# Patient Record
Sex: Male | Born: 1975 | Race: Black or African American | Hispanic: No | Smoking: Never smoker
Health system: Southern US, Community
[De-identification: ages and names within clinical notes are randomized; demographics above are authoritative.]

## PROBLEM LIST (undated history)

## (undated) DIAGNOSIS — G43909 Migraine, unspecified, not intractable, without status migrainosus: Secondary | ICD-10-CM

## (undated) DIAGNOSIS — K219 Gastro-esophageal reflux disease without esophagitis: Secondary | ICD-10-CM

## (undated) DIAGNOSIS — E119 Type 2 diabetes mellitus without complications: Secondary | ICD-10-CM

## (undated) HISTORY — PX: WRIST SURGERY: SHX841

## (undated) HISTORY — PX: PANCREAS SURGERY: SHX731

---

## 2015-11-13 ENCOUNTER — Encounter (HOSPITAL_COMMUNITY): Payer: Self-pay

## 2015-11-13 ENCOUNTER — Inpatient Hospital Stay (HOSPITAL_COMMUNITY): Payer: Medicaid - Out of State

## 2015-11-13 ENCOUNTER — Inpatient Hospital Stay (HOSPITAL_COMMUNITY)
Admission: EM | Admit: 2015-11-13 | Discharge: 2015-11-15 | DRG: 193 | Disposition: A | Discharge status: Home / Self Care | Payer: Medicaid - Out of State | Attending: Internal Medicine | Admitting: Internal Medicine

## 2015-11-13 ENCOUNTER — Emergency Department (HOSPITAL_COMMUNITY): Payer: Medicaid - Out of State

## 2015-11-13 DIAGNOSIS — J9601 Acute respiratory failure with hypoxia: Secondary | ICD-10-CM | POA: Diagnosis present

## 2015-11-13 DIAGNOSIS — Z8701 Personal history of pneumonia (recurrent): Secondary | ICD-10-CM | POA: Diagnosis not present

## 2015-11-13 DIAGNOSIS — Z9114 Patient's other noncompliance with medication regimen: Secondary | ICD-10-CM

## 2015-11-13 DIAGNOSIS — E860 Dehydration: Secondary | ICD-10-CM | POA: Diagnosis present

## 2015-11-13 DIAGNOSIS — E871 Hypo-osmolality and hyponatremia: Secondary | ICD-10-CM | POA: Diagnosis present

## 2015-11-13 DIAGNOSIS — J189 Pneumonia, unspecified organism: Secondary | ICD-10-CM | POA: Diagnosis not present

## 2015-11-13 DIAGNOSIS — B009 Herpesviral infection, unspecified: Secondary | ICD-10-CM | POA: Diagnosis present

## 2015-11-13 DIAGNOSIS — E1365 Other specified diabetes mellitus with hyperglycemia: Secondary | ICD-10-CM

## 2015-11-13 DIAGNOSIS — E0865 Diabetes mellitus due to underlying condition with hyperglycemia: Secondary | ICD-10-CM | POA: Diagnosis present

## 2015-11-13 DIAGNOSIS — K219 Gastro-esophageal reflux disease without esophagitis: Secondary | ICD-10-CM | POA: Diagnosis present

## 2015-11-13 DIAGNOSIS — Z8619 Personal history of other infectious and parasitic diseases: Secondary | ICD-10-CM

## 2015-11-13 DIAGNOSIS — E08649 Diabetes mellitus due to underlying condition with hypoglycemia without coma: Secondary | ICD-10-CM | POA: Diagnosis not present

## 2015-11-13 DIAGNOSIS — R197 Diarrhea, unspecified: Secondary | ICD-10-CM

## 2015-11-13 DIAGNOSIS — R000 Tachycardia, unspecified: Secondary | ICD-10-CM

## 2015-11-13 DIAGNOSIS — Z23 Encounter for immunization: Secondary | ICD-10-CM

## 2015-11-13 DIAGNOSIS — Z794 Long term (current) use of insulin: Secondary | ICD-10-CM

## 2015-11-13 DIAGNOSIS — E1165 Type 2 diabetes mellitus with hyperglycemia: Secondary | ICD-10-CM | POA: Diagnosis present

## 2015-11-13 HISTORY — DX: Type 2 diabetes mellitus without complications: E11.9

## 2015-11-13 HISTORY — DX: Gastro-esophageal reflux disease without esophagitis: K21.9

## 2015-11-13 HISTORY — DX: Cystic fibrosis, unspecified: E84.9

## 2015-11-13 LAB — CBC WITH DIFFERENTIAL/PLATELET
BASOS ABS: 0 10*3/uL (ref 0.0–0.1)
BASOS PCT: 1 %
EOS ABS: 0.1 10*3/uL (ref 0.0–0.7)
EOS PCT: 1 %
HCT: 33.5 % — ABNORMAL LOW (ref 39.0–52.0)
Hemoglobin: 11 g/dL — ABNORMAL LOW (ref 13.0–17.0)
Lymphocytes Relative: 18 %
Lymphs Abs: 1.4 10*3/uL (ref 0.7–4.0)
MCH: 29.9 pg (ref 26.0–34.0)
MCHC: 32.8 g/dL (ref 30.0–36.0)
MCV: 91 fL (ref 78.0–100.0)
MONOS PCT: 15 %
Monocytes Absolute: 1.2 10*3/uL — ABNORMAL HIGH (ref 0.1–1.0)
NEUTROS ABS: 5.3 10*3/uL (ref 1.7–7.7)
Neutrophils Relative %: 65 %
PLATELETS: 286 10*3/uL (ref 150–400)
RBC: 3.68 MIL/uL — ABNORMAL LOW (ref 4.22–5.81)
RDW: 12.9 % (ref 11.5–15.5)
WBC: 8 10*3/uL (ref 4.0–10.5)

## 2015-11-13 LAB — BASIC METABOLIC PANEL
ANION GAP: 9 (ref 5–15)
BUN: 7 mg/dL (ref 6–20)
CALCIUM: 8.4 mg/dL — AB (ref 8.9–10.3)
CO2: 23 mmol/L (ref 22–32)
CREATININE: 0.84 mg/dL (ref 0.61–1.24)
Chloride: 98 mmol/L — ABNORMAL LOW (ref 101–111)
Glucose, Bld: 409 mg/dL — ABNORMAL HIGH (ref 65–99)
Potassium: 4.3 mmol/L (ref 3.5–5.1)
SODIUM: 130 mmol/L — AB (ref 135–145)

## 2015-11-13 LAB — GLUCOSE, CAPILLARY
GLUCOSE-CAPILLARY: 198 mg/dL — AB (ref 65–99)
GLUCOSE-CAPILLARY: 146 mg/dL — AB (ref 65–99)
GLUCOSE-CAPILLARY: 56 mg/dL — AB (ref 65–99)
Glucose-Capillary: 355 mg/dL — ABNORMAL HIGH (ref 65–99)
Glucose-Capillary: 331 mg/dL — ABNORMAL HIGH (ref 65–99)
Glucose-Capillary: 137 mg/dL — ABNORMAL HIGH (ref 65–99)

## 2015-11-13 LAB — RAPID URINE DRUG SCREEN, HOSP PERFORMED
AMPHETAMINES: NOT DETECTED
BENZODIAZEPINES: NOT DETECTED
Barbiturates: NOT DETECTED
Cocaine: NOT DETECTED
OPIATES: POSITIVE — AB
Tetrahydrocannabinol: NOT DETECTED

## 2015-11-13 LAB — INFLUENZA PANEL BY PCR (TYPE A & B)
H1N1FLUPCR: NOT DETECTED
Influenza A By PCR: NEGATIVE
Influenza B By PCR: NEGATIVE

## 2015-11-13 LAB — PROCALCITONIN: Procalcitonin: <0.1 ng/mL

## 2015-11-13 LAB — STREP PNEUMONIAE URINARY ANTIGEN: STREP PNEUMO URINARY ANTIGEN: NEGATIVE

## 2015-11-13 MED ORDER — SODIUM CHLORIDE 7 % IN NEBU: 4.0000 mL | INHALATION_SOLUTION | Freq: Every day | RESPIRATORY_TRACT | Status: DC

## 2015-11-13 MED ORDER — INSULIN ASPART 100 UNIT/ML ~~LOC~~ SOLN
0.0000 [IU] | Freq: Three times a day (TID) | SUBCUTANEOUS | Status: DC
  Administered 2015-11-13: 2 [IU] via SUBCUTANEOUS

## 2015-11-13 MED ORDER — INSULIN DETEMIR 100 UNIT/ML ~~LOC~~ SOLN
40.0000 [IU] | Freq: Every day | SUBCUTANEOUS | Status: DC
  Administered 2015-11-13: 40 [IU] via SUBCUTANEOUS
  Filled 2015-11-13: qty 0.4

## 2015-11-13 MED ORDER — PANCRELIPASE (LIP-PROT-AMYL) 12000-38000 UNITS PO CPEP: 48000.0000 [IU] | ORAL_CAPSULE | Freq: Three times a day (TID) | ORAL | Status: DC

## 2015-11-13 MED ORDER — BUDESONIDE 0.25 MG/2ML IN SUSP
0.2500 mg | Freq: Two times a day (BID) | RESPIRATORY_TRACT | Status: DC
  Administered 2015-11-13 – 2015-11-15 (×5): 0.25 mg via RESPIRATORY_TRACT
  Filled 2015-11-13 (×5): qty 2

## 2015-11-13 MED ORDER — MAGNESIUM SULFATE 2 GM/50ML IV SOLN
2.0000 g | Freq: Once | INTRAVENOUS | Status: AC
  Administered 2015-11-13: 2 g via INTRAVENOUS
  Filled 2015-11-13: qty 50

## 2015-11-13 MED ORDER — IOHEXOL 350 MG/ML SOLN
100.0000 mL | Freq: Once | INTRAVENOUS | Status: AC | PRN
  Administered 2015-11-13: 100 mL via INTRAVENOUS

## 2015-11-13 MED ORDER — SODIUM CHLORIDE 0.9 % IV SOLN
INTRAVENOUS | Status: DC
  Administered 2015-11-13 – 2015-11-14 (×3): via INTRAVENOUS

## 2015-11-13 MED ORDER — PREDNISONE 20 MG PO TABS
60.0000 mg | ORAL_TABLET | Freq: Once | ORAL | Status: AC
  Administered 2015-11-13: 60 mg via ORAL
  Filled 2015-11-13: qty 3

## 2015-11-13 MED ORDER — ADULT MULTIVITAMIN W/MINERALS CH
1.0000 | ORAL_TABLET | Freq: Every day | ORAL | Status: DC
  Administered 2015-11-13 – 2015-11-15 (×3): 1 via ORAL
  Filled 2015-11-13 (×3): qty 1

## 2015-11-13 MED ORDER — ALBUTEROL (5 MG/ML) CONTINUOUS INHALATION SOLN
10.0000 mg/h | INHALATION_SOLUTION | RESPIRATORY_TRACT | Status: DC
  Administered 2015-11-13: 10 mg/h via RESPIRATORY_TRACT
  Filled 2015-11-13: qty 20

## 2015-11-13 MED ORDER — SODIUM CHLORIDE 0.9 % IV BOLUS (SEPSIS)
1000.0000 mL | Freq: Once | INTRAVENOUS | Status: AC
  Administered 2015-11-13: 1000 mL via INTRAVENOUS

## 2015-11-13 MED ORDER — INSULIN DETEMIR 100 UNIT/ML ~~LOC~~ SOLN
45.0000 [IU] | Freq: Every day | SUBCUTANEOUS | Status: DC
  Filled 2015-11-13: qty 0.45

## 2015-11-13 MED ORDER — ACETAMINOPHEN 325 MG PO TABS
650.0000 mg | ORAL_TABLET | Freq: Four times a day (QID) | ORAL | Status: DC | PRN
  Administered 2015-11-13 – 2015-11-14 (×2): 650 mg via ORAL
  Filled 2015-11-13 (×2): qty 2

## 2015-11-13 MED ORDER — SODIUM CHLORIDE 3 % IN NEBU
4.0000 mL | INHALATION_SOLUTION | Freq: Every day | RESPIRATORY_TRACT | Status: DC
  Administered 2015-11-14 – 2015-11-15 (×2): 4 mL via RESPIRATORY_TRACT
  Filled 2015-11-13 (×4): qty 4

## 2015-11-13 MED ORDER — INSULIN ASPART 100 UNIT/ML ~~LOC~~ SOLN
0.0000 [IU] | SUBCUTANEOUS | Status: DC
  Administered 2015-11-13: 2 [IU] via SUBCUTANEOUS
  Administered 2015-11-13: 7 [IU] via SUBCUTANEOUS

## 2015-11-13 MED ORDER — ENOXAPARIN SODIUM 40 MG/0.4ML ~~LOC~~ SOLN
40.0000 mg | SUBCUTANEOUS | Status: DC
  Administered 2015-11-13 – 2015-11-14 (×2): 40 mg via SUBCUTANEOUS
  Filled 2015-11-13 (×3): qty 0.4

## 2015-11-13 MED ORDER — DEXTROSE 5 % IV SOLN
2.0000 g | Freq: Three times a day (TID) | INTRAVENOUS | Status: DC
  Administered 2015-11-13 – 2015-11-14 (×5): 2 g via INTRAVENOUS
  Filled 2015-11-13 (×8): qty 2

## 2015-11-13 MED ORDER — LEVOFLOXACIN IN D5W 750 MG/150ML IV SOLN
750.0000 mg | INTRAVENOUS | Status: DC
  Administered 2015-11-14: 750 mg via INTRAVENOUS
  Filled 2015-11-13: qty 150

## 2015-11-13 MED ORDER — LEVOFLOXACIN IN D5W 750 MG/150ML IV SOLN
750.0000 mg | Freq: Once | INTRAVENOUS | Status: AC
  Administered 2015-11-13: 750 mg via INTRAVENOUS
  Filled 2015-11-13: qty 150

## 2015-11-13 MED ORDER — INSULIN ASPART 100 UNIT/ML ~~LOC~~ SOLN: 0.0000 [IU] | Freq: Every day | SUBCUTANEOUS | Status: DC

## 2015-11-13 MED ORDER — IPRATROPIUM BROMIDE 0.02 % IN SOLN
1.0000 mg | Freq: Once | RESPIRATORY_TRACT | Status: AC
  Administered 2015-11-13: 1 mg via RESPIRATORY_TRACT
  Filled 2015-11-13: qty 5

## 2015-11-13 MED ORDER — PANTOPRAZOLE SODIUM 40 MG PO TBEC
40.0000 mg | DELAYED_RELEASE_TABLET | Freq: Every day | ORAL | Status: DC
  Administered 2015-11-13 – 2015-11-15 (×3): 40 mg via ORAL
  Filled 2015-11-13 (×3): qty 1

## 2015-11-13 MED ORDER — INSULIN ASPART 100 UNIT/ML ~~LOC~~ SOLN
3.0000 [IU] | Freq: Three times a day (TID) | SUBCUTANEOUS | Status: DC
  Administered 2015-11-14: 3 [IU] via SUBCUTANEOUS

## 2015-11-13 MED ORDER — PANCRELIPASE (LIP-PROT-AMYL) 12000-38000 UNITS PO CPEP: 48000.0000 [IU] | ORAL_CAPSULE | Freq: Two times a day (BID) | ORAL | Status: DC | PRN

## 2015-11-13 MED ORDER — GUAIFENESIN ER 600 MG PO TB12
600.0000 mg | ORAL_TABLET | Freq: Two times a day (BID) | ORAL | Status: DC
  Administered 2015-11-13 – 2015-11-15 (×5): 600 mg via ORAL
  Filled 2015-11-13 (×5): qty 1

## 2015-11-13 MED ORDER — CEFEPIME HCL 2 G IJ SOLR
2.0000 g | Freq: Once | INTRAMUSCULAR | Status: AC
  Administered 2015-11-13: 2 g via INTRAVENOUS
  Filled 2015-11-13: qty 2

## 2015-11-13 MED ORDER — ALBUTEROL SULFATE (2.5 MG/3ML) 0.083% IN NEBU
2.5000 mg | INHALATION_SOLUTION | RESPIRATORY_TRACT | Status: DC | PRN
  Administered 2015-11-15: 2.5 mg via RESPIRATORY_TRACT
  Filled 2015-11-13: qty 3

## 2015-11-13 MED ORDER — PANCRELIPASE (LIP-PROT-AMYL) 12000-38000 UNITS PO CPEP
72000.0000 [IU] | ORAL_CAPSULE | Freq: Three times a day (TID) | ORAL | Status: DC
  Administered 2015-11-13 – 2015-11-15 (×7): 72000 [IU] via ORAL
  Filled 2015-11-13 (×7): qty 6

## 2015-11-13 NOTE — Progress Notes (Signed)
PROGRESS NOTE  Clinton SchullerRobert Mitchell ZOX:[id number] DOB: [birth year] DOA: 11/13/2015 PCP: Pcp Not In System  Brief History 39 year old male with a history of cystic fibrosis and diabetes mellitus presented with acute onset of pleuritic chest pain and shortness of breath while he was at work on the evening of 11/12/2015. The patient states that he has been in his usual state of health in this past week without any fevers, chills, shortness of breath, coughing, vomiting. He denies any recent sick contacts. While at work, he had an incessant coughing episode with associated chest pain or shortness of breath. As a result, EMS was activated. The patient has altered his story to different physicians. He previously stated he was hospitalized 2 months prior to this admission with a similar illness. However, he stated to me he has not been hospitalized for "several years" with pneumonia.  The patient also previously stated [place] pul[place] in [place], but he told me he sees a pul[place] in [place]. he denies any recent sick contacts. He states that he has not been very faithful to his Qvar inhaler. Assessment/Plan: Acute respiratory failure with hypoxia -Concerns for infectious etiology versus occupational exposure -Concern for pulmonary embolus given the acuity of the patient's presentation -CT angiogram chest -HIV antibody  -urine drug screen Pulmonary infiltrates  -treat as an infectious etiology for now -Influenza PCR negative  -Respiratory viral panel  -Continue cefepime and levofloxacin pending culture data -procalcitonin Diabetes mellitus with hyperglycemia -C peptide -increase levemir to 45 units -add novolog 3 units with meals Cystic Fibrosis -continue pulmicort -continue Creon Pseudohyponatremia  -Secondary to elevated blood sugar   Family Communication:   Pt at beside Disposition Plan:   Home 1-2 days if stable       Procedures/Studies: Dg Chest 2  View  11/13/2015  CLINICAL DATA:  39 year old male with cough. History of cystic fibrosis. EXAM: CHEST  2 VIEW COMPARISON:  None. FINDINGS: Two views of the chest demonstrate diffuse bilateral airspace nodular opacities predominantly involving the upper and mid lung fields most concerning for pneumonia. There is no focal consolidation, pleural effusion, or pneumothorax. The cardiac silhouette is within normal limits. The osseous structures are grossly unremarkable. IMPRESSION: Bilateral upper and mid lung field airspace nodular densities concerning for pneumonia. Clinical correlation is recommended. Electronically Signed   By: Elgie CollardArash  Radparvar M.D.   On: 11/13/2015 01:59         Subjective: Patient denies fevers, chills, headache, chest pain, dyspnea, nausea, vomiting, diarrhea, abdominal pain, dysuria, hematuria   Objective: Filed Vitals:   11/13/15 0513 11/13/15 0838 11/13/15 1012 11/13/15 1648  BP: 111/73 112/71  129/77  Pulse: 97 92  89  Temp: 98.2 F (36.8 C) 98 F (36.7 C)  98.1 F (36.7 C)  TempSrc: Oral Oral  Oral  Resp: 18 17  18   Height:      Weight: 68.9 kg (151 lb 14.4 oz)     SpO2: 97% 97% 96% 97%    Intake/Output Summary (Last 24 hours) at 11/13/15 1655 Last data filed at 11/13/15 1450  Gross per 24 hour  Intake 741.25 ml  Output    950 ml  Net -208.75 ml   Weight change:  Exam:   General:  Pt is alert, follows commands appropriately, not in acute distress  HEENT: No icterus, No thrush, No neck mass, Benson/AT  Cardiovascular: RRR, S1/S2, no rubs, no gallops  Respiratory: Bilateral crackles. No wheeze. Good air  movement   Abdomen: Soft/+BS, non tender, non distended, no guarding; no hepatosplenomegaly   Extremities: No edema, No lymphangitis, No petechiae, No rashes, no synovitis; clubbing without cyanosis   Data Reviewed: Basic Metabolic Panel:  Recent Labs Lab 11/13/15 0138  NA 130*  K 4.3  CL 98*  CO2 23  GLUCOSE 409*  BUN 7  CREATININE  0.84  CALCIUM 8.4*   Liver Function Tests: No results for input(s): AST, ALT, ALKPHOS, BILITOT, PROT, ALBUMIN in the last 168 hours. No results for input(s): LIPASE, AMYLASE in the last 168 hours. No results for input(s): AMMONIA in the last 168 hours. CBC:  Recent Labs Lab 11/13/15 0138  WBC 8.0  NEUTROABS 5.3  HGB 11.0*  HCT 33.5*  MCV 91.0  PLT 286   Cardiac Enzymes: No results for input(s): CKTOTAL, CKMB, CKMBINDEX, TROPONINI in the last 168 hours. BNP: Invalid input(s): POCBNP CBG:  Recent Labs Lab 11/13/15 0530 11/13/15 0757 11/13/15 1208  GLUCAP 355* 331* 198*    No results found for this or any previous visit (from the past 240 hour(s)).   Scheduled Meds: . budesonide (PULMICORT) nebulizer solution  0.25 mg Nebulization BID  . ceFEPime (MAXIPIME) IV  2 g Intravenous 3 times per day  . enoxaparin (LOVENOX) injection  40 mg Subcutaneous Q24H  . guaiFENesin  600 mg Oral BID  . insulin aspart  0-9 Units Subcutaneous 6 times per day  . insulin detemir  40 Units Subcutaneous Daily  . [START ON 11/14/2015] levofloxacin (LEVAQUIN) IV  750 mg Intravenous Q24H  . lipase/protease/amylase  72,000 Units Oral TID WC  . multivitamin with minerals  1 tablet Oral Daily  . pantoprazole  40 mg Oral Daily  . sodium chloride HYPERTONIC  4 mL Nebulization Daily   Continuous Infusions: . sodium chloride 75 mL/hr at 11/13/15 0543  . albuterol Stopped (11/13/15 0248)     Clinton Provencal, DO  [facility] Pager [phone]  If 7PM-7AM, please contact night-coverage [email or web address] Password [email or web address] 11/13/2015, 4:55 PM   LOS: 0 days

## 2015-11-13 NOTE — ED Notes (Signed)
Pt placed on 2L o2, stats dropped into mid 80 on room air while sleeping.

## 2015-11-13 NOTE — ED Provider Notes (Signed)
CSN: [id number]     Arrival date & time 11/13/15  0050 History  By signing my name below, I, Phillis HaggisGabriella Gaje, attest that this documentation has been prepared under the direction and in the presence of Tomasita CrumbleAdeleke Phil Michels, MD. Electronically Signed: Phillis HaggisGabriella Gaje, ED Scribe. 11/13/2015. 12:54 AM.    Chief Complaint  Patient presents with  . Cough   The history is provided by the patient. No language interpreter was used.  HPI Comments: Clinton Mitchell is a 39 y.o. male with a hx of cystic fibrosis and DM who presents to the Emergency Department brought in by EMS complaining of gradually worsening productive cough with thick green sputum onset PTA. 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 He reports associated SOB, headache, neck pain, and chest pain which preceded his cough. Pt reports that he uses a nebulizer and inhaler for cystic fibrosis but has not used any today. He says that this feels like a cystic fibrosis flare up. He also reports that he had pneumonia about 1 month ago and finished his course of anti-biotics to relief. He denies fever, chills, nausea, vomiting, or recent long travel.   Past Medical History  Diagnosis Date  . Cystic fibrosis (HCC)   . Diabetes mellitus without complication (HCC)   . Acid reflux    No past surgical history on file. No family history on file. Social History  Substance Use Topics  . Smoking status: Not on file  . Smokeless tobacco: Not on file  . Alcohol Use: Not on file    Review of Systems 10 Systems reviewed and all are negative for acute change except as noted in the HPI.  Allergies  Review of patient's allergies indicates not on file.  Home Medications   Prior to Admission medications   Not on File   BP 112/84 mmHg  Pulse 119  Temp(Src) 98.8 F (37.1 C) (Oral)  Resp 22  Ht 5\' 11"  (1.803 m)  Wt 150 lb (68.04 kg)  BMI 20.93 kg/m2  SpO2 94% Physical Exam  Constitutional: He is oriented to person,  place, and time. Vital signs are normal. He appears well-developed and well-nourished.  Non-toxic appearance. He does not appear ill. No distress.  HENT:  Head: Normocephalic and atraumatic.  Nose: Nose normal.  Mouth/Throat: Oropharynx is clear and moist. No oropharyngeal exudate.  Eyes: Conjunctivae and EOM are normal. Pupils are equal, round, and reactive to light. No scleral icterus.  Neck: Normal range of motion. Neck supple. No tracheal deviation, no edema, no erythema and normal range of motion present. No thyroid mass and no thyromegaly present.  Cardiovascular: Regular rhythm, S1 normal, S2 normal, normal heart sounds, intact distal pulses and normal pulses.  Tachycardia present.  Exam reveals no gallop and no friction rub.   No murmur heard. Pulmonary/Chest: He has no wheezes. He has no rhonchi. He has no rales.  Decreased aeration; coughing on exam  Abdominal: Soft. Normal appearance and bowel sounds are normal. He exhibits no distension, no ascites and no mass. There is no hepatosplenomegaly. There is no tenderness. There is no rebound, no guarding and no CVA tenderness.  Musculoskeletal: Normal range of motion. He exhibits no edema or tenderness.  Lymphadenopathy:    He has no cervical adenopathy.  Neurological: He is alert and oriented to person, place, and time. He has normal strength. No cranial nerve deficit or sensory deficit.  Skin: Skin is warm, dry and intact. No petechiae and  no rash noted. He is not diaphoretic. No erythema. No pallor.  Psychiatric: He has a normal mood and affect. His behavior is normal. Judgment normal.  Nursing note and vitals reviewed.   ED Course  Procedures (including critical care time) DIAGNOSTIC STUDIES: Oxygen Saturation is 94% on RA, adequate by my interpretation.    COORDINATION OF CARE: 1:07 AM-Discussed treatment plan which includes CT scan and breathing treatments with pt at bedside and pt agreed to plan.    Labs Review Labs  Reviewed  CBC WITH DIFFERENTIAL/PLATELET - Abnormal; Notable for the following:    RBC 3.68 (*)    Hemoglobin 11.0 (*)    HCT 33.5 (*)    Monocytes Absolute 1.2 (*)    All other components within normal limits  BASIC METABOLIC PANEL - Abnormal; Notable for the following:    Sodium 130 (*)    Chloride 98 (*)    Glucose, Bld 409 (*)    Calcium 8.4 (*)    All other components within normal limits  CULTURE, BLOOD (ROUTINE X 2)  CULTURE, BLOOD (ROUTINE X 2)    Imaging Review Dg Chest 2 View  11/13/2015  CLINICAL DATA:  39 year old male with cough. History of cystic fibrosis. EXAM: CHEST  2 VIEW COMPARISON:  None. FINDINGS: Two views of the chest demonstrate diffuse bilateral airspace nodular opacities predominantly involving the upper and mid lung fields most concerning for pneumonia. There is no focal consolidation, pleural effusion, or pneumothorax. The cardiac silhouette is within normal limits. The osseous structures are grossly unremarkable. IMPRESSION: Bilateral upper and mid lung field airspace nodular densities concerning for pneumonia. Clinical correlation is recommended. Electronically Signed   By: Elgie Collard M.D.   On: 11/13/2015 01:59   I have personally reviewed and evaluated these images and lab results as part of my medical decision-making.   EKG Interpretation   Date/Time:  Thursday November 13 2015 01:04:40 EST Ventricular Rate:  117 PR Interval:  157 QRS Duration: 84 QT Interval:  301 QTC Calculation: 420 R Axis:   81 Text Interpretation:  Sinus tachycardia No old tracing to compare  Confirmed by Erroll Luna [phone]) on 11/13/2015 1:24:04 AM      MDM   Final diagnoses:  None   Patient presents to the ED for evaluation of SOB.  CXR shows BUL pneumonia.  In the setting of h/o CF, will treat for possible pseudomonas.  Blood cultures drawn, he was given levoquin and cefepime.  Will admit to the hospitalist for further care.      CRITICAL  CARE Performed by: Tomasita Crumble   Total critical care time: 35 minutes - respiratory distress  Critical care time was exclusive of separately billable procedures and treating other patients.  Critical care was necessary to treat or prevent imminent or life-threatening deterioration.  Critical care was time spent personally by me on the following activities: development of treatment plan with patient and/or surrogate as well as nursing, discussions with consultants, evaluation of patient's response to treatment, examination of patient, obtaining history from patient or surrogate, ordering and performing treatments and interventions, ordering and review of laboratory studies, ordering and review of radiographic studies, pulse oximetry and re-evaluation of patient's condition.  I personally performed the services described in this documentation, which was scribed in my presence. The recorded information has been reviewed and is accurate.      Tomasita Crumble, MD 11/13/15 [phone]

## 2015-11-13 NOTE — ED Notes (Signed)
Dr. Oni at the bedside.  

## 2015-11-13 NOTE — Progress Notes (Signed)
Attempted report.  Stated she will call back.

## 2015-11-13 NOTE — Progress Notes (Signed)
ANTIBIOTIC CONSULT NOTE - INITIAL  Pharmacy Consult for Levaquin Indication: pneumonia  No Known Allergies  Labs:  Recent Labs  11/13/15 0138  WBC 8.0  HGB 11.0*  PLT 286  CREATININE 0.84   Estimated Creatinine Clearance: 114.7 mL/min (by C-G formula based on Cr of 0.84).  Medical History: Past Medical History  Diagnosis Date  . Cystic fibrosis (HCC)   . Diabetes mellitus without complication (HCC)   . Acid reflux     Assessment/Plan:  39yo male had PNA 3wk ago, completed ABX course, now c/o "violent coughing fit" during first night working assembly line at [employer], EMS notes strong chemical odor, pt reports sx feel like cystic fibrosis flare up, CXR concerning for PNA, to begin IV ABX, to begin IV ABX.  Will start Levaquin 750mg  IV Q24H and monitor CBC and Cx.  Vernard GamblesVeronda Yovana Scogin, PharmD, BCPS  11/13/2015,4:26 AM

## 2015-11-13 NOTE — ED Notes (Signed)
Patient transported to X-ray 

## 2015-11-13 NOTE — ED Notes (Addendum)
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 EMS reports strong chemical odor upon arrival. Pt had PNA 1 week ago and finished antibiotic. Coughed up some thick green sputum PTA of EMS. Placed on 2 liters Simpson for comfort. Pt has cystic fibrosis. DM and sugar after eating at 2230 now 421 by EMS. Pt reports the PNA was 3 weeks ago

## 2015-11-13 NOTE — H&P (Addendum)
[facility] History and Physical  Camdon Saetern NFA:[id number] DOB: 21-Sep-1976 DOA: 11/13/2015  Referring physician: ED PCP: Pcp Not In System   Chief Complaint: Shortness of breath  HPI:  Mr. Dettore is a 39 year old male with a past medical history significant for diabetes mellitus and cystic fibrosis; who presents after having acute shortness of breath while at work. 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 He reported acute onset dry heaving as if he had to vomit although was unable to do so. He reports significant shortness of breath for which EMS was called. Initially he reports being nasal cannula oxygen  and a breathing treatment which caused him to start coughing up significant amounts of thick green mucus. He reports having associated symptoms of chills, pleuritic chest pain. Patient notes that he's had similar symptoms like this in the past and was hospitalized last approximately 2 months ago. He reports going to a pulmonary clinic in [place], [place] where he is from and is under the care of Dr. Sherril Cong".  Also notes a history of diarrhea over the last 2 weeks reporting anywhere from 2-7 stools in a day. Patient notes the last flu/ pneumonia vaccine was probably 2 years ago. Upon admission to the emergency department patient is checked with a chest x-ray which showed bilateral upper and mid lung airspace opacities concerning for pneumonia. His initial blood glucose is elevated at 409, anion gap 9.  Review of Systems  Constitutional: Positive for chills.       Decreased appetite  HENT: Negative for ear discharge and ear pain.   Eyes: Negative for photophobia and pain.  Respiratory: Positive for cough, hemoptysis (Blood-tinged sputum), sputum production and shortness of breath.   Cardiovascular: Positive for chest pain (Pleuritic). Negative for leg swelling.  Gastrointestinal: Positive for diarrhea. Negative for abdominal pain, blood  in stool and melena.  Genitourinary: Positive for frequency. Negative for urgency and hematuria.  Musculoskeletal: Positive for neck pain.  Skin: Negative for itching and rash.  Neurological: Negative for seizures and loss of consciousness.  Endo/Heme/Allergies: Negative for environmental allergies.  Psychiatric/Behavioral: Negative for suicidal ideas and memory loss.      Past Medical History  Diagnosis Date  . Cystic fibrosis (HCC)   . Diabetes mellitus without complication (HCC)   . Acid reflux      Past Surgical History  Procedure Laterality Date  . Pancreas surgery    . Wrist surgery Right       Social History:  reports that he has never smoked. He does not have any smokeless tobacco history on file. He reports that he drinks alcohol. He reports that he does not use illicit drugs.  No Known Allergies  No family history on file.    Prior to Admission medications   Medication Sig Start Date End Date Taking? Authorizing Provider  albuterol (PROVENTIL HFA;VENTOLIN HFA) 108 (90 BASE) MCG/ACT inhaler Inhale 2 puffs into the lungs every 6 (six) hours as needed for wheezing or shortness of breath.   Yes Historical Provider, MD  albuterol (PROVENTIL) (2.5 MG/3ML) 0.083% nebulizer solution Take 2.5 mg by nebulization daily.    Yes Historical Provider, MD  beclomethasone (QVAR) 80 MCG/ACT inhaler Inhale 1 puff into the lungs every 12 (twelve) hours.   Yes Historical Provider, MD  famciclovir (FAMVIR) 250 MG tablet Take 250 mg by mouth daily.   Yes Historical Provider, MD  Insulin Detemir (LEVEMIR FLEXTOUCH) 100 UNIT/ML Pen Inject 40 Units into the  skin daily.   Yes Historical Provider, MD  Multiple Vitamin (MULTIVITAMIN WITH MINERALS) TABS tablet Take 1 tablet by mouth daily.   Yes Historical Provider, MD  omeprazole (PRILOSEC) 20 MG capsule Take 20 mg by mouth daily.   Yes Historical Provider, MD  Pancrelipase, Lip-Prot-Amyl, (CREON) 24000 UNITS CPEP Take 2-3 capsules by mouth 3  (three) times daily with meals. Three with meals and two with snacks   Yes Historical Provider, MD  Sodium Chloride, Inhalant, 7 % NEBU Inhale 4 mLs into the lungs daily.    Yes Historical Provider, MD     Physical Exam: Filed Vitals:   11/13/15 0051 11/13/15 0054 11/13/15 0126  BP:  112/84   Pulse:  119 110  Temp:  98.8 F (37.1 C)   TempSrc:  Oral   Resp:  22 22  Height:   (1.803 m)   Weight:  68.04 kg (150 lb)   SpO2: 94% 94%      Constitutional: Vital signs reviewed. Patient is a well-developed and well-nourished in no acute distress and cooperative with exam. Alert and oriented x3.  Head: Normocephalic and atraumatic  Ear: TM normal bilaterally  Mouth: no erythema or exudates, MMM  Eyes: PERRL, EOMI, conjunctivae normal, No scleral icterus.  Neck: Supple, Trachea midline normal ROM, No JVD, mass, thyromegaly, or carotid bruit present.  Cardiovascular:Tachycardic   Pulmonary/Chest: Decreased air movement with scattered bilateral crackles Abdominal: Soft. Non-tender, non-distended, bowel sounds are normal, no masses, organomegaly, or guarding present.  GU: no CVA tenderness Musculoskeletal: No joint deformities, erythema, or stiffness, ROM full and no nontender Ext: no edema and no cyanosis, pulses palpable bilaterally (DP and PT)  Hematology: no cervical, inginal, or axillary adenopathy.  Neurological: A&O x3, Strenght is normal and symmetric bilaterally, cranial nerve II-XII are grossly intact, no focal motor deficit, sensory intact to light touch bilaterally.  Skin: Warm, dry and intact. No rash, cyanosis, or clubbing.  Psychiatric: Normal mood and affect. speech and behavior is normal. Judgment and thought content normal. Cognition and memory are normal.      Data Review   Micro Results No results found for this or any previous visit (from the past 240 hour(s)).  Radiology Reports Dg Chest 2 View  11/13/2015  CLINICAL DATA:  39 year old male with cough.  History of cystic fibrosis. EXAM: CHEST  2 VIEW COMPARISON:  None. FINDINGS: Two views of the chest demonstrate diffuse bilateral airspace nodular opacities predominantly involving the upper and mid lung fields most concerning for pneumonia. There is no focal consolidation, pleural effusion, or pneumothorax. The cardiac silhouette is within normal limits. The osseous structures are grossly unremarkable. IMPRESSION: Bilateral upper and mid lung field airspace nodular densities concerning for pneumonia. Clinical correlation is recommended. Electronically Signed   By: Elgie Collard M.D.   On: 11/13/2015 01:59     CBC  Recent Labs Lab 11/13/15 0138  WBC 8.0  HGB 11.0*  HCT 33.5*  PLT 286  MCV 91.0  MCH 29.9  MCHC 32.8  RDW 12.9  LYMPHSABS 1.4  MONOABS 1.2*  EOSABS 0.1  BASOSABS 0.0    Chemistries   Recent Labs Lab 11/13/15 0138  NA 130*  K 4.3  CL 98*  CO2 23  GLUCOSE 409*  BUN 7  CREATININE 0.84  CALCIUM 8.4*   ------------------------------------------------------------------------------------------------------------------ estimated creatinine clearance is 114.7 mL/min (by C-G formula based on Cr of 0.84). ------------------------------------------------------------------------------------------------------------------ No results for input(s): HGBA1C in the last 72 hours. ------------------------------------------------------------------------------------------------------------------ No results for input(s): CHOL,  HDL, LDLCALC, TRIG, CHOLHDL, LDLDIRECT in the last 72 hours. ------------------------------------------------------------------------------------------------------------------ No results for input(s): TSH, T4TOTAL, T3FREE, THYROIDAB in the last 72 hours.  Invalid input(s): FREET3 ------------------------------------------------------------------------------------------------------------------ No results for input(s): VITAMINB12, FOLATE, FERRITIN, TIBC, IRON,  RETICCTPCT in the last 72 hours.  Coagulation profile No results for input(s): INR, PROTIME in the last 168 hours.  No results for input(s): DDIMER in the last 72 hours.  Cardiac Enzymes No results for input(s): CKMB, TROPONINI, MYOGLOBIN in the last 168 hours.  Invalid input(s): CK ------------------------------------------------------------------------------------------------------------------ Invalid input(s): POCBNP   CBG: No results for input(s): GLUCAP in the last 168 hours.     EKG: Independently reviewed. Sinus tachycardia heart rate 117   Assessment/Plan Active Problems:  CAP (community acquired pneumonia): Acute patient with acute onset of shortness of breath. Chest x-ray showing bilateral pneumonia. Patient initially received prednisone,breathing treatment, magnesium sulfate, and 2 L normal saline IV fluids along with antibiotics. Vital signs stable satting well on room air. - Admit to telemetry bed - continuous pulse oximetry - sputum cultures and Gram stain  - Checking for Legionella, strep, influenza screen  - Continue antibiotics of cefepime and Levaquin as patient is at risk for Pseudomonas - Albuterol and Budesonide Nebs scheduled - mucinex  Cystic fibrosis: Patient apparently follows [place]ology clinic [place]. Reports previous pneumonia in the last 2 months or so.  - may want to try and obtain records from his pulmonologist  - continue breathing treatments as seen above - Continue home medications of  Creon - Continue hypertonic saline nebs  - May warrant trial of N-acetylcysteine if still having mucus plugging   Sinus Tachycardia: heart rates in the 110's. - Continue to monitor  Diabetes mellitus with hyperglycemia: Secondary to history of cystic fibrosis. Patient initial blood glucose elevated at 409. Anion gap 9. - IV fluids - CBG checks Every 4 hours - Continue patient's home dose of long-acting insulin   Pseudohyponatremia:  Initial sodium level 130 on admission, but when correcting for hyperglycemia noted to be within normal limits.  - Continue to monitor   Diarrhea: May warrant further workup. Patient denies any recent antibiotics the last month. -Check C. difficile stool  History of herpes: for which patient takes famciclovir, but denies any current outbreaks.  - held  famciclovir  Code Status:   full Family Communication: bedside Disposition Plan: admit   Total time spent 55 minutes.Greater than 50% of this time was spent in counseling, explanation of diagnosis, planning of further management, and coordination of care  Clydie BraunRondell A Smith [facility] Pager [phone]  If 7PM-7AM, please contact night-coverage [email or web address] Password [email or web address] 11/13/2015, 3:11 AM

## 2015-11-13 NOTE — Progress Notes (Addendum)
New Admission Note:   Arrival: On bed from ED with tech Mental Orientation: A&Ox4 Telemetry: Placed on box 6e27 Assessment:  See doc flowsheet Skin: Intact.  Old scar on chest.  Multiple tattoos on body. IV: Left forearm IV Pain: None Safety Measures:  Call bell placed within reach; patient instructed on use of call bell and verbalized understanding. Bed in lowest position.  Yellow bracelet on.   [address] [place] Orientation: Patient oriented to staff, room, and unit. Family: None at bedside  Orders have been reviewed and implemented. Upon admission to unit, CGB was 355.  No orders for sliding scale insulin coverage currently.  Paged MD on-call.  Also, patient refused to answer admission questions at this time.  Stated he will answer them later.  Will continue to monitor.  Rozann Leschesebecca Everlean Bucher, RN, BSN

## 2015-11-13 NOTE — Progress Notes (Signed)
Inpatient Diabetes Program Recommendations  AACE/ADA: New Consensus Statement on Inpatient Glycemic Control (2015)  Target Ranges:  Prepandial:   less than 140 mg/dL      Peak postprandial:   less than 180 mg/dL (1-2 hours)      Critically ill patients:  140 - 180 mg/dL   Review of Glycemic Control Results for Clinton Mitchell, Clinton Mitchell (MRN [id number]) as of 11/13/2015 09:52  Ref. Range 11/13/2015 05:30 11/13/2015 07:57  Glucose-Capillary Latest Ref Range: 65-99 mg/dL 409355 (H) 811331 (H)    Outpatient Diabetes medications: Levemir 40 units daily Current orders for Inpatient glycemic control: 40 units Levemir daily; Sensitive correction scale  6 times/day.  Inpatient Diabetes Program Recommendations:  Noted order for pt to begin CHO modified diet.  Please change correction to [facility] and HS.  Adjust as needed to reach blood sugar goals.  Clinton KohutSherrie Suzi Mitchell RD, CDE. M.Ed. Pager [phone] Inpatient Diabetes Coordinator

## 2015-11-13 NOTE — ED Notes (Signed)
Attempted report 

## 2015-11-13 NOTE — ED Notes (Signed)
Pt returned from xray

## 2015-11-14 LAB — GLUCOSE, CAPILLARY
GLUCOSE-CAPILLARY: 39 mg/dL — AB (ref 65–99)
GLUCOSE-CAPILLARY: 107 mg/dL — AB (ref 65–99)
GLUCOSE-CAPILLARY: 65 mg/dL (ref 65–99)
GLUCOSE-CAPILLARY: 92 mg/dL (ref 65–99)
Glucose-Capillary: 79 mg/dL (ref 65–99)
Glucose-Capillary: 72 mg/dL (ref 65–99)

## 2015-11-14 LAB — COMPREHENSIVE METABOLIC PANEL
ALBUMIN: 2.3 g/dL — AB (ref 3.5–5.0)
ALT: 12 U/L — ABNORMAL LOW (ref 17–63)
ANION GAP: 6 (ref 5–15)
AST: 13 U/L — AB (ref 15–41)
Alkaline Phosphatase: 76 U/L (ref 38–126)
BILIRUBIN TOTAL: 0.7 mg/dL (ref 0.3–1.2)
BUN: 7 mg/dL (ref 6–20)
CHLORIDE: 104 mmol/L (ref 101–111)
CO2: 29 mmol/L (ref 22–32)
Calcium: 8.7 mg/dL — ABNORMAL LOW (ref 8.9–10.3)
Creatinine, Ser: 0.83 mg/dL (ref 0.61–1.24)
GFR calc Af Amer: >60 mL/min (ref >60)
GFR calc non Af Amer: >60 mL/min (ref >60)
GLUCOSE: 119 mg/dL — AB (ref 65–99)
POTASSIUM: 3.5 mmol/L (ref 3.5–5.1)
SODIUM: 139 mmol/L (ref 135–145)
TOTAL PROTEIN: 5.6 g/dL — AB (ref 6.5–8.1)

## 2015-11-14 LAB — HIV ANTIBODY (ROUTINE TESTING W REFLEX): HIV Screen 4th Generation wRfx: NONREACTIVE

## 2015-11-14 LAB — LEGIONELLA ANTIGEN, URINE

## 2015-11-14 LAB — EXPECTORATED SPUTUM ASSESSMENT W GRAM STAIN, RFLX TO RESP C

## 2015-11-14 LAB — HEMOGLOBIN A1C
Hgb A1c MFr Bld: 7.2 % — ABNORMAL HIGH (ref 4.8–5.6)
Mean Plasma Glucose: 160 mg/dL

## 2015-11-14 LAB — EXPECTORATED SPUTUM ASSESSMENT W REFEX TO RESP CULTURE

## 2015-11-14 MED ORDER — INSULIN DETEMIR 100 UNIT/ML ~~LOC~~ SOLN
40.0000 [IU] | Freq: Every day | SUBCUTANEOUS | Status: DC
  Administered 2015-11-14: 40 [IU] via SUBCUTANEOUS
  Filled 2015-11-14: qty 0.4

## 2015-11-14 MED ORDER — INSULIN DETEMIR 100 UNIT/ML ~~LOC~~ SOLN
20.0000 [IU] | Freq: Every day | SUBCUTANEOUS | Status: DC
  Administered 2015-11-15: 20 [IU] via SUBCUTANEOUS
  Filled 2015-11-14: qty 0.2

## 2015-11-14 MED ORDER — INSULIN ASPART 100 UNIT/ML ~~LOC~~ SOLN: 0.0000 [IU] | Freq: Every day | SUBCUTANEOUS | Status: DC

## 2015-11-14 MED ORDER — INSULIN DETEMIR 100 UNIT/ML ~~LOC~~ SOLN: 25.0000 [IU] | Freq: Every day | SUBCUTANEOUS | Status: DC

## 2015-11-14 MED ORDER — INFLUENZA VAC SPLIT QUAD 0.5 ML IM SUSY
0.5000 mL | PREFILLED_SYRINGE | INTRAMUSCULAR | Status: AC
  Administered 2015-11-15: 0.5 mL via INTRAMUSCULAR
  Filled 2015-11-14: qty 0.5

## 2015-11-14 MED ORDER — POTASSIUM CHLORIDE 2 MEQ/ML IV SOLN
INTRAVENOUS | Status: DC
  Administered 2015-11-14: 22:00:00 via INTRAVENOUS
  Filled 2015-11-14 (×3): qty 1000

## 2015-11-14 MED ORDER — INSULIN ASPART 100 UNIT/ML ~~LOC~~ SOLN
0.0000 [IU] | Freq: Three times a day (TID) | SUBCUTANEOUS | Status: DC
  Administered 2015-11-15: 3 [IU] via SUBCUTANEOUS

## 2015-11-14 NOTE — Progress Notes (Signed)
Inpatient Diabetes Program Recommendations  AACE/ADA: New Consensus Statement on Inpatient Glycemic Control (2015)  Target Ranges:  Prepandial:   less than 140 mg/dL      Peak postprandial:   less than 180 mg/dL (1-2 hours)      Critically ill patients:  140 - 180 mg/dL  Results for Clinton Mitchell, Rawleigh (MRN [id number]) as of 11/14/2015 10:45  Ref. Range 11/13/2015 05:30 11/13/2015 07:57 11/13/2015 12:08 11/13/2015 16:47 11/13/2015 21:44 11/13/2015 22:34 11/14/2015 07:38  Glucose-Capillary Latest Ref Range: 65-99 mg/dL 409355 (H) 811331 (H) 914198 (H) 146 (H) 56 (L) 137 (H) 79   Review of Glycemic Control  Current orders for Inpatient glycemic control: Levemir 40 units daily, Novolog 0-9 units TID with meals, Novolog 0-5 units HS, Novolog 3 units TID with meals  Inpatient Diabetes Program Recommendations: Insulin - Basal: Noted fasting glucose 79 mg/dl this morning. If glucose continues to be low, please conisder decreasing Levemir to 38 units daily. Insulin - Meal Coverage: Noted glucose down to 56 mg/dl at 78:2921:44 last night. Patient received Prednisone 60 mg on 12/22 at 1:35 am. No other steroids are ordered. May want to consider discontinuing meal coverage.  Thanks, Orlando PennerMarie Harlan Vinal, RN, MSN, CDE Diabetes Coordinator Inpatient Diabetes Program [phone] (Team Pager from 8am to 5pm) [phone] ([facility] office) [phone] [facility] office) [phone] [facility] office)

## 2015-11-14 NOTE — Progress Notes (Signed)
SATURATION QUALIFICATIONS: (This note is used to comply with regulatory documentation for home oxygen)  Patient Saturations on Room Air at Rest = 100%  Patient Saturations on Room Air while Ambulating = 98%  Patient Saturations on 0 Liters of oxygen while Ambulating = 98%  Please briefly explain why patient needs home oxygen: n/a

## 2015-11-14 NOTE — Progress Notes (Signed)
PROGRESS NOTE  Clinton Mitchell ZOX:[id number] DOB: November 02, 1976 DOA: 11/13/2015 PCP: Pcp Not In System  Brief History 39 year old male with a history of cystic fibrosis and diabetes mellitus presented with acute onset of pleuritic chest pain and shortness of breath while he was at work on the evening of 11/12/2015. The patient states that he has been in his usual state of health in this past week without any fevers, chills, shortness of breath, coughing, vomiting. He denies any recent sick contacts. While at work, he had an incessant coughing episode with associated chest pain or shortness of breath. As a result, EMS was activated. The patient has altered his story to different physicians. He previously stated he was hospitalized 2 months prior to this admission with a similar illness. However, he stated to me he has not been hospitalized for "several years" with pneumonia. The patient also previously stated he saw a pulmonologist in [place], [place], but he told me he sees a pulmonologist in [place], [place]. he denies any recent sick contacts. He states that he has not been very faithful to his Qvar inhaler. Assessment/Plan: Acute respiratory failure with hypoxia -Concerns for infectious etiology versus occupational exposure -Concern for pulmonary embolus given the acuity of the patient's presentation -CT angiogram chest--negative pulmonary embolus, scarring versus soft tissue opacity/mass right apex--infectious versus inflammatory consolidation -Will need surveillance CT chest after he is finished with antibiotics in 4-6 weeks -HIV antibody--neg  -urine drug screen--positive opiates -now stable on RA--he walked without desat Pulmonary infiltrates  -treat as an infectious etiology for now -Influenza PCR negative  -Respiratory viral panel--pending  -Continue cefepime -d/c levofloxacin -procalcitonin <0.10 Diabetes mellitus with hyperglycemia -C peptide--pending -decrease levemir  to 20 units due to hypoglycemia -suspect noncompliance at home as pt has become hypoglycemia on home dose of levemir -change to D5NS due to continued hypoglycemia Cystic Fibrosis -continue pulmicort -continue Creon Pseudohyponatremia  -Secondary to elevated blood sugar  Family Communication: Pt at beside Disposition Plan: Home 12/24 if afebrile and hypoglycemia improved   Procedures/Studies: Dg Chest 2 View  11/13/2015  CLINICAL DATA:  39 year old male with cough. History of cystic fibrosis. EXAM: CHEST  2 VIEW COMPARISON:  None. FINDINGS: Two views of the chest demonstrate diffuse bilateral airspace nodular opacities predominantly involving the upper and mid lung fields most concerning for pneumonia. There is no focal consolidation, pleural effusion, or pneumothorax. The cardiac silhouette is within normal limits. The osseous structures are grossly unremarkable. IMPRESSION: Bilateral upper and mid lung field airspace nodular densities concerning for pneumonia. Clinical correlation is recommended. Electronically Signed   By: Elgie Collard M.D.   On: 11/13/2015 01:59   Ct Angio Chest Pe W/cm &/or Wo Cm  11/13/2015  CLINICAL DATA:  History of cystic fibrosis. Patient complains of shortness of breath and intermittent chest pain. EXAM: CT ANGIOGRAPHY CHEST WITH CONTRAST TECHNIQUE: Multidetector CT imaging of the chest was performed using the standard protocol during bolus administration of intravenous contrast. Multiplanar CT image reconstructions and MIPs were obtained to evaluate the vascular anatomy. CONTRAST:  OMNIPAQUE IOHEXOL 350 MG/ML SOLN COMPARISON:  Chest radiograph dated 11/13/2015 FINDINGS: Mediastinum/Lymph Nodes: No pulmonary emboli or thoracic aortic dissection identified. No masses or pathologically enlarged lymph nodes identified. There are bilateral shotty hilar lymph nodes. Lungs/Pleura: There is extensive bilateral upper lobe predominant cylindrical bronchiectasis,  peribronchial thickening and diffuse tree-in-bud opacities. There is a soft tissue thickening in the right apex which measures approximately 2.4 x 2.3 x  2.1 cm, and demonstrates linear extension to the apical pleura and areas of severe bronchiectasis. Upper abdomen: No acute findings. Musculoskeletal: No chest wall mass or suspicious bone lesions identified. Review of the MIP images confirms the above findings. IMPRESSION: Extensive upper lobe predominant bronchiectasis and interstitial lung changes, likely secondary to cystic fibrosis. Scarring versus soft tissue mass in the right apex. Differential diagnosis includes infectious or inflammatory consolidation versus less likely true pulmonary mass. Comparison to prior CT of the thorax will be most useful. If one is not available, short term, three-month follow-up is recommended. Diffuse tree in bud opacities with lower lobe predominance, which suggest an element of acute inflammatory changes. Electronically Signed   By: Ted Mcalpineobrinka  Dimitrova M.D.   On: 11/13/2015 18:48         Subjective: Patient denies fevers, chills, headache, chest pain, dyspnea, nausea, vomiting, diarrhea, abdominal pain, dysuria, hematuria; coughing improved   Objective: Filed Vitals:   11/14/15 0458 11/14/15 0941 11/14/15 1010 11/14/15 1844  BP: 114/77  115/60 118/67  Pulse: 83 96 109 98  Temp: 97.7 F (36.5 C)  98.5 F (36.9 C) 98.3 F (36.8 C)  TempSrc: Oral  Oral Oral  Resp: 18 20 20 18   Height:      Weight:      SpO2: 99% 99% 94% 96%    Intake/Output Summary (Last 24 hours) at 11/14/15 1905 Last data filed at 11/14/15 1800  Gross per 24 hour  Intake   2670 ml  Output   1975 ml  Net    695 ml   Weight change: 0.76 kg (1 lb 10.8 oz) Exam:   General:  Pt is alert, follows commands appropriately, not in acute distress  HEENT: No icterus, No thrush, No neck mass, [place]/AT  Cardiovascular: RRR, S1/S2, no rubs, no gallops  Respiratory: Bilateral scattered  rales without wheezing   Abdomen: Soft/+BS, non tender, non distended, no guarding  Extremities: No edema, No lymphangitis, No petechiae, No rashes, no synovitis  Data Reviewed: Basic Metabolic Panel:  Recent Labs Lab 11/13/15 0138 11/14/15 0546  NA 130* 139  K 4.3 3.5  CL 98* 104  CO2 23 29  GLUCOSE 409* 119*  BUN 7 7  CREATININE 0.84 0.83  CALCIUM 8.4* 8.7*   Liver Function Tests:  Recent Labs Lab 11/14/15 0546  AST 13*  ALT 12*  ALKPHOS 76  BILITOT 0.7  PROT 5.6*  ALBUMIN 2.3*   No results for input(s): LIPASE, AMYLASE in the last 168 hours. No results for input(s): AMMONIA in the last 168 hours. CBC:  Recent Labs Lab 11/13/15 0138  WBC 8.0  NEUTROABS 5.3  HGB 11.0*  HCT 33.5*  MCV 91.0  PLT 286   Cardiac Enzymes: No results for input(s): CKTOTAL, CKMB, CKMBINDEX, TROPONINI in the last 168 hours. BNP: Invalid input(s): POCBNP CBG:  Recent Labs Lab 11/14/15 0738 11/14/15 1141 11/14/15 1203 11/14/15 1648 11/14/15 1724  GLUCAP 79 39* 72 65 107*    Recent Results (from the past 240 hour(s))  Culture, blood (Routine X 2) w Reflex to ID Panel     Status: None (Preliminary result)   Collection Time: 11/13/15  2:42 AM  Result Value Ref Range Status   Specimen Description BLOOD LEFT HAND  Final   Special Requests AEROBIC BOTTLE ONLY 10ML  Final   Culture NO GROWTH 1 DAY  Final   Report Status PENDING  Incomplete  Culture, blood (Routine X 2) w Reflex to ID Panel  Status: None (Preliminary result)   Collection Time: 11/13/15  2:45 AM  Result Value Ref Range Status   Specimen Description BLOOD RIGHT ARM  Final   Special Requests BOTTLES DRAWN AEROBIC AND ANAEROBIC  Final   Culture NO GROWTH 1 DAY  Final   Report Status PENDING  Incomplete  Culture, sputum-assessment     Status: None   Collection Time: 11/13/15 11:05 PM  Result Value Ref Range Status   Specimen Description EXPECTORATED SPUTUM  Final   Special Requests NONE  Final    Sputum evaluation   Final    THIS SPECIMEN IS ACCEPTABLE. RESPIRATORY CULTURE REPORT TO FOLLOW.   Report Status 11/14/2015 FINAL  Final  Culture, respiratory (NON-Expectorated)     Status: None (Preliminary result)   Collection Time: 11/13/15 11:05 PM  Result Value Ref Range Status   Specimen Description EXPECTORATED SPUTUM  Final   Special Requests NONE  Final   Gram Stain   Final    MODERATE WBC PRESENT, PREDOMINANTLY PMN FEW SQUAMOUS EPITHELIAL CELLS PRESENT FEW GRAM NEGATIVE RODS FEW GRAM POSITIVE COCCI IN PAIRS    Culture   Final    NO GROWTH 1 DAY Performed at [facility]    Report Status PENDING  Incomplete     Scheduled Meds: . budesonide (PULMICORT) nebulizer solution  0.25 mg Nebulization BID  . ceFEPime (MAXIPIME) IV  2 g Intravenous 3 times per day  . enoxaparin (LOVENOX) injection  40 mg Subcutaneous Q24H  . guaiFENesin  600 mg Oral BID  . [START ON 11/15/2015] Influenza vac split quadrivalent PF  0.5 mL Intramuscular Tomorrow-1000  . insulin aspart  0-5 Units Subcutaneous QHS  . insulin aspart  0-9 Units Subcutaneous TID WC  . [START ON 11/15/2015] insulin detemir  25 Units Subcutaneous Daily  . lipase/protease/amylase  72,000 Units Oral TID WC  . multivitamin with minerals  1 tablet Oral Daily  . pantoprazole  40 mg Oral Daily  . sodium chloride HYPERTONIC  4 mL Nebulization Daily   Continuous Infusions: . sodium chloride 75 mL/hr at 11/14/15 1720  . albuterol Stopped (11/13/15 0248)     Kelicia Youtz, DO  [facility] Pager [phone]  If 7PM-7AM, please contact night-coverage [email or web address] Password [email or web address] 11/14/2015, 7:05 PM   LOS: 1 day

## 2015-11-14 NOTE — Progress Notes (Signed)
Hypoglycemic Event  CBG: 39  Treatment: 15 GM carbohydrate snack  Symptoms: None  Follow-up CBG: Time:1204 CBG Result:72  Possible Reasons for Event: Unknown  Comments/MD notified: Dr. Arbutus Leasat notified. Will continue to monitor.    Stark KleinLuke A Drystan Reader

## 2015-11-14 NOTE — Progress Notes (Signed)
Hypoglycemic Event  CBG: 65  Treatment: 15 GM carbohydrate snack  Symptoms: None  Follow-up CBG: Time:1723 CBG Result:107  Possible Reasons for Event: Unknown  Comments/MD notified:Dr. Tat notified    Stark KleinLuke A Lilliahna Schubring

## 2015-11-15 LAB — BASIC METABOLIC PANEL
Anion gap: 5 (ref 5–15)
BUN: 7 mg/dL (ref 6–20)
CALCIUM: 8.6 mg/dL — AB (ref 8.9–10.3)
CHLORIDE: 102 mmol/L (ref 101–111)
CO2: 29 mmol/L (ref 22–32)
CREATININE: 0.89 mg/dL (ref 0.61–1.24)
GFR calc Af Amer: >60 mL/min (ref >60)
Glucose, Bld: 287 mg/dL — ABNORMAL HIGH (ref 65–99)
Potassium: 3.9 mmol/L (ref 3.5–5.1)
SODIUM: 136 mmol/L (ref 135–145)

## 2015-11-15 LAB — PROCALCITONIN

## 2015-11-15 LAB — HEMOGLOBIN A1C
Hgb A1c MFr Bld: 7 % — ABNORMAL HIGH (ref 4.8–5.6)
Mean Plasma Glucose: 154 mg/dL

## 2015-11-15 LAB — C-PEPTIDE: C-Peptide: 0.3 ng/mL — ABNORMAL LOW (ref 1.1–4.4)

## 2015-11-15 LAB — GLUCOSE, CAPILLARY
GLUCOSE-CAPILLARY: 235 mg/dL — AB (ref 65–99)
GLUCOSE-CAPILLARY: 292 mg/dL — AB (ref 65–99)

## 2015-11-15 MED ORDER — INSULIN DETEMIR 100 UNIT/ML ~~LOC~~ SOLN: 20.0000 [IU] | Freq: Every day | SUBCUTANEOUS | Status: DC

## 2015-11-15 MED ORDER — LEVOFLOXACIN 750 MG PO TABS: 750.0000 mg | ORAL_TABLET | Freq: Every day | ORAL | Status: AC

## 2015-11-15 NOTE — Care Management (Signed)
CM met with patient, he is a [place] resident has medication assistance with a health insurance plan. Patient denies any difficulty obtaining medications. Discussed patient finding a PCP in his local area. No further CM needs.

## 2015-11-15 NOTE — Discharge Summary (Signed)
Discharge Summary  Clinton Mitchell NWG:[id number] DOB: 1975-12-30  PCP: Pcp Not In System  Admit date: 11/13/2015 Discharge date: 11/15/2015  Time spent: 25 minutes   Recommendations for Outpatient Follow-up:  1. Patient given referral for community [facility] wellness [facility] 2.  Medication change: Levemir decreased to 20 units subcutaneous daily 3. New medication: Levaquin 750 mg by mouth 4 days 4. Patient has a follow-up appointment at [facility] in the next 4 weeks. At that time, recommending repeat chest CT for right apical mass/infiltrate  Discharge Diagnoses:  Active Hospital Problems   Diagnosis Date Noted  . CAP (community acquired pneumonia) 11/13/2015  . Cystic fibrosis (HCC) 11/13/2015  . Hyponatremia 11/13/2015  . Sinus tachycardia (HCC) 11/13/2015  . Diabetes mellitus with hyperglycemia (HCC) 11/13/2015  . Diarrhea 11/13/2015  . History of herpes simplex infection 11/13/2015  . Acute respiratory failure with hypoxemia [facility])     Resolved Hospital Problems   Diagnosis Date Noted Date Resolved  No resolved problems to display.    Discharge Condition: Improved, being discharged home   Diet recommendation: Carb modified   Filed Weights   11/13/15 0513 11/13/15 2052 11/14/15 2033  Weight: 68.9 kg (151 lb 14.4 oz) 68.8 kg (151 lb 10.8 oz) 69 kg (152 lb 1.9 oz)    History of present illness:  39 year old male with past history of cystic fibrosis and diabetes mellitus started having 1 day of pleuritic chest pain and shortness of breath starting 12/21 so came into the emergency room on 12/22. Patient also had intractable coughing along with some associated shortness of breath. He had been hospitalized prior for pneumonia. In the emergency room, patient found to be hypoxic secondary to pneumonia. Patient admitted to hospitalist service  Hospital Course:  Principal Problem:  Acute respiratory failure with hypoxemia secondary to CAP (community acquired pneumonia): CT angiogram of  chest negative for PE, but did note soft tissue opacity versus scarring versus soft tissue mass in the right apex. Infectious versus inflammatory consolidation. Recommendation is for surveillance CT of the chest in 4-6 weeks. Flu PCR negative. Pro calcitonin level much improved after by following day. Patient otherwise stable and by 12/24, breathing comfortably on room air with normal white blood cell count. He was discharged on 4 more days of Levaquin for total 7 days of antibiotics. HIV antibody negative Active Problems:   Cystic fibrosis (HCC): Stable continue on Pulmicort    Hyponatremia: Secondary dehydration. Resolved with fluids.    Sinus tachycardia (HCC): Second 2 hypoxemia, resolved    Diabetes mellitus with hyperglycemia [facility]): Patient had episodes of hypoglycemia once home dose of insulin restarted in the hospital. Suspect that he is been noncompliant at home. Recommend he take his insulin consistently which he says he will decrease dose to 20 units subcutaneous daily at bedtime    Procedures:  None   Consultations:  None   Discharge Exam: BP 95/58 mmHg  Pulse 89  Temp(Src) 98 F (36.7 C) (Oral)  Resp 17  Ht 5\' 11"  (1.803 m)  Wt 69 kg (152 lb 1.9 oz)  BMI 21.23 kg/m2  SpO2 96%  General: Alert and oriented 3 Cardiovascular: Regular rate and rhythm, S1-S2  Respiratory: Clear to auscultation bilaterally   Discharge Instructions You were cared for by a hospitalist during your hospital stay. If you have any questions about your discharge medications or the care you received while you were in the hospital after you are discharged, you can call the unit and asked to speak with the hospitalist on  call if the hospitalist that took care of you is not available. Once you are discharged, your primary care physician will handle any further medical issues. Please note that NO REFILLS for any discharge medications will be authorized once you are discharged, as it is imperative that  you return to your primary care physician (or establish a relationship with a primary care physician if you do not have one) for your aftercare needs so that they can reassess your need for medications and monitor your lab values.  Discharge Instructions    Diet general    Complete by:  As directed      Increase activity slowly    Complete by:  As directed             Medication List    TAKE these medications        albuterol (2.5 MG/3ML) 0.083% nebulizer solution  Commonly known as:  PROVENTIL  Take 2.5 mg by nebulization daily.     albuterol 108 (90 BASE) MCG/ACT inhaler  Commonly known as:  PROVENTIL HFA;VENTOLIN HFA  Inhale 2 puffs into the lungs every 6 (six) hours as needed for wheezing or shortness of breath.     beclomethasone 80 MCG/ACT inhaler  Commonly known as:  QVAR  Inhale 1 puff into the lungs every 12 (twelve) hours.     CREON 24000 UNITS Cpep  Generic drug:  Pancrelipase (Lip-Prot-Amyl)  Take 2-3 capsules by mouth 3 (three) times daily with meals. Three with meals and two with snacks     famciclovir 250 MG tablet  Commonly known as:  FAMVIR  Take 250 mg by mouth daily.     insulin detemir 100 UNIT/ML injection  Commonly known as:  LEVEMIR  Inject 0.2 mLs (20 Units total) into the skin daily.     levofloxacin 750 MG tablet  Commonly known as:  LEVAQUIN  Take 1 tablet (750 mg total) by mouth daily.     multivitamin with minerals Tabs tablet  Take 1 tablet by mouth daily.     omeprazole 20 MG capsule  Commonly known as:  PRILOSEC  Take 20 mg by mouth daily.     Sodium Chloride (Inhalant) 7 % Nebu  Inhale 4 mLs into the lungs daily.       No Known Allergies    The results of significant diagnostics from this hospitalization (including imaging, microbiology, ancillary and laboratory) are listed below for reference.    Significant Diagnostic Studies: Dg Chest 2 View  11/13/2015  CLINICAL DATA:  39 year old male with cough. History of cystic  fibrosis. EXAM: CHEST  2 VIEW COMPARISON:  None. FINDINGS: Two views of the chest demonstrate diffuse bilateral airspace nodular opacities predominantly involving the upper and mid lung fields most concerning for pneumonia. There is no focal consolidation, pleural effusion, or pneumothorax. The cardiac silhouette is within normal limits. The osseous structures are grossly unremarkable. IMPRESSION: Bilateral upper and mid lung field airspace nodular densities concerning for pneumonia. Clinical correlation is recommended. Electronically Signed   By: Elgie Collard M.D.   On: 11/13/2015 01:59   Ct Angio Chest Pe W/cm &/or Wo Cm  11/13/2015  CLINICAL DATA:  History of cystic fibrosis. Patient complains of shortness of breath and intermittent chest pain. EXAM: CT ANGIOGRAPHY CHEST WITH CONTRAST TECHNIQUE: Multidetector CT imaging of the chest was performed using the standard protocol during bolus administration of intravenous contrast. Multiplanar CT image reconstructions and MIPs were obtained to evaluate the vascular anatomy. CONTRAST:  OMNIPAQUE  IOHEXOL 350 MG/ML SOLN COMPARISON:  Chest radiograph dated 11/13/2015 FINDINGS: Mediastinum/Lymph Nodes: No pulmonary emboli or thoracic aortic dissection identified. No masses or pathologically enlarged lymph nodes identified. There are bilateral shotty hilar lymph nodes. Lungs/Pleura: There is extensive bilateral upper lobe predominant cylindrical bronchiectasis, peribronchial thickening and diffuse tree-in-bud opacities. There is a soft tissue thickening in the right apex which measures approximately 2.4 x 2.3 x 2.1 cm, and demonstrates linear extension to the apical pleura and areas of severe bronchiectasis. Upper abdomen: No acute findings. Musculoskeletal: No chest wall mass or suspicious bone lesions identified. Review of the MIP images confirms the above findings. IMPRESSION: Extensive upper lobe predominant bronchiectasis and interstitial lung changes,  likely secondary to cystic fibrosis. Scarring versus soft tissue mass in the right apex. Differential diagnosis includes infectious or inflammatory consolidation versus less likely true pulmonary mass. Comparison to prior CT of the thorax will be most useful. If one is not available, short term, three-month follow-up is recommended. Diffuse tree in bud opacities with lower lobe predominance, which suggest an element of acute inflammatory changes. Electronically Signed   By: Ted Mcalpineobrinka  Dimitrova M.D.   On: 11/13/2015 18:48    Microbiology: Recent Results (from the past 240 hour(s))  Culture, blood (Routine X 2) w Reflex to ID Panel     Status: None (Preliminary result)   Collection Time: 11/13/15  2:42 AM  Result Value Ref Range Status   Specimen Description BLOOD LEFT HAND  Final   Special Requests AEROBIC BOTTLE ONLY 10ML  Final   Culture NO GROWTH 2 DAYS  Final   Report Status PENDING  Incomplete  Culture, blood (Routine X 2) w Reflex to ID Panel     Status: None (Preliminary result)   Collection Time: 11/13/15  2:45 AM  Result Value Ref Range Status   Specimen Description BLOOD RIGHT ARM  Final   Special Requests BOTTLES DRAWN AEROBIC AND ANAEROBIC 10ML  Final   Culture NO GROWTH 2 DAYS  Final   Report Status PENDING  Incomplete  Culture, sputum-assessment     Status: None   Collection Time: 11/13/15 11:05 PM  Result Value Ref Range Status   Specimen Description EXPECTORATED SPUTUM  Final   Special Requests NONE  Final   Sputum evaluation   Final    THIS SPECIMEN IS ACCEPTABLE. RESPIRATORY CULTURE REPORT TO FOLLOW.   Report Status 11/14/2015 FINAL  Final  Culture, respiratory (NON-Expectorated)     Status: None (Preliminary result)   Collection Time: 11/13/15 11:05 PM  Result Value Ref Range Status   Specimen Description EXPECTORATED SPUTUM  Final   Special Requests NONE  Final   Gram Stain   Final    MODERATE WBC PRESENT, PREDOMINANTLY PMN FEW SQUAMOUS EPITHELIAL CELLS  PRESENT FEW GRAM NEGATIVE RODS FEW GRAM POSITIVE COCCI IN PAIRS    Culture   Final    Culture reincubated for better growth Performed at [facility]    Report Status PENDING  Incomplete     Labs: Basic Metabolic Panel:  Recent Labs Lab 11/13/15 0138 11/14/15 0546 11/15/15 0549  NA 130* 139 136  K 4.3 3.5 3.9  CL 98* 104 102  CO2 23 29 29   GLUCOSE 409* 119* 287*  BUN 7 7 7   CREATININE 0.84 0.83 0.89  CALCIUM 8.4* 8.7* 8.6*   Liver Function Tests:  Recent Labs Lab 11/14/15 0546  AST 13*  ALT 12*  ALKPHOS 76  BILITOT 0.7  PROT 5.6*  ALBUMIN 2.3*  No results for input(s): LIPASE, AMYLASE in the last 168 hours. No results for input(s): AMMONIA in the last 168 hours. CBC:  Recent Labs Lab 11/13/15 0138  WBC 8.0  NEUTROABS 5.3  HGB 11.0*  HCT 33.5*  MCV 91.0  PLT 286   Cardiac Enzymes: No results for input(s): CKTOTAL, CKMB, CKMBINDEX, TROPONINI in the last 168 hours. BNP: BNP (last 3 results) No results for input(s): BNP in the last 8760 hours.  ProBNP (last 3 results) No results for input(s): PROBNP in the last 8760 hours.  CBG:  Recent Labs Lab 11/14/15 1648 11/14/15 1724 11/14/15 2037 11/15/15 0726 11/15/15 1115  GLUCAP 65 107* 92 235* 292*       Signed:  KRISHNAN,SENDIL K  [facility] 11/15/2015, 4:15 PM

## 2015-11-15 NOTE — Progress Notes (Signed)
Discharge instructions and medications discussed with patient.  All questions answered. Letter for work given to patient.

## 2015-11-17 LAB — CULTURE, RESPIRATORY W GRAM STAIN

## 2015-11-17 LAB — CULTURE, RESPIRATORY

## 2015-11-17 LAB — RESPIRATORY VIRUS PANEL
Adenovirus: NEGATIVE
INFLUENZA A: NEGATIVE
INFLUENZA B 1: NEGATIVE
METAPNEUMOVIRUS: NEGATIVE
PARAINFLUENZA 1 A: NEGATIVE
PARAINFLUENZA 2 A: NEGATIVE
PARAINFLUENZA 3 A: NEGATIVE
RESPIRATORY SYNCYTIAL VIRUS A: NEGATIVE
Respiratory Syncytial Virus B: NEGATIVE
Rhinovirus: NEGATIVE

## 2015-11-18 LAB — CULTURE, BLOOD (ROUTINE X 2)
CULTURE: NO GROWTH
CULTURE: NO GROWTH

## 2015-12-05 ENCOUNTER — Ambulatory Visit: Payer: Self-pay | Admitting: Family Medicine

## 2017-11-07 ENCOUNTER — Observation Stay (HOSPITAL_COMMUNITY)
Admission: EM | Admit: 2017-11-07 | Discharge: 2017-11-08 | Disposition: A | Discharge status: Home / Self Care | Payer: Medicaid - Out of State | Attending: Internal Medicine | Admitting: Internal Medicine

## 2017-11-07 ENCOUNTER — Emergency Department (HOSPITAL_COMMUNITY): Payer: Medicaid - Out of State

## 2017-11-07 ENCOUNTER — Encounter (HOSPITAL_COMMUNITY): Payer: Self-pay | Admitting: Emergency Medicine

## 2017-11-07 ENCOUNTER — Inpatient Hospital Stay (HOSPITAL_COMMUNITY): Payer: Medicaid - Out of State

## 2017-11-07 ENCOUNTER — Other Ambulatory Visit: Payer: Self-pay

## 2017-11-07 DIAGNOSIS — Z794 Long term (current) use of insulin: Secondary | ICD-10-CM | POA: Insufficient documentation

## 2017-11-07 DIAGNOSIS — E876 Hypokalemia: Secondary | ICD-10-CM | POA: Insufficient documentation

## 2017-11-07 DIAGNOSIS — J479 Bronchiectasis, uncomplicated: Secondary | ICD-10-CM | POA: Diagnosis not present

## 2017-11-07 DIAGNOSIS — R739 Hyperglycemia, unspecified: Secondary | ICD-10-CM

## 2017-11-07 DIAGNOSIS — J189 Pneumonia, unspecified organism: Secondary | ICD-10-CM | POA: Diagnosis present

## 2017-11-07 DIAGNOSIS — Z7951 Long term (current) use of inhaled steroids: Secondary | ICD-10-CM | POA: Diagnosis not present

## 2017-11-07 DIAGNOSIS — J180 Bronchopneumonia, unspecified organism: Principal | ICD-10-CM | POA: Insufficient documentation

## 2017-11-07 DIAGNOSIS — R590 Localized enlarged lymph nodes: Secondary | ICD-10-CM | POA: Insufficient documentation

## 2017-11-07 DIAGNOSIS — Z8249 Family history of ischemic heart disease and other diseases of the circulatory system: Secondary | ICD-10-CM | POA: Insufficient documentation

## 2017-11-07 DIAGNOSIS — R042 Hemoptysis: Secondary | ICD-10-CM | POA: Diagnosis not present

## 2017-11-07 DIAGNOSIS — N62 Hypertrophy of breast: Secondary | ICD-10-CM | POA: Diagnosis not present

## 2017-11-07 DIAGNOSIS — E1165 Type 2 diabetes mellitus with hyperglycemia: Secondary | ICD-10-CM | POA: Diagnosis not present

## 2017-11-07 DIAGNOSIS — J029 Acute pharyngitis, unspecified: Secondary | ICD-10-CM | POA: Insufficient documentation

## 2017-11-07 DIAGNOSIS — Z79899 Other long term (current) drug therapy: Secondary | ICD-10-CM | POA: Diagnosis not present

## 2017-11-07 DIAGNOSIS — E0865 Diabetes mellitus due to underlying condition with hyperglycemia: Secondary | ICD-10-CM

## 2017-11-07 DIAGNOSIS — R079 Chest pain, unspecified: Secondary | ICD-10-CM | POA: Diagnosis not present

## 2017-11-07 DIAGNOSIS — B379 Candidiasis, unspecified: Secondary | ICD-10-CM | POA: Diagnosis not present

## 2017-11-07 DIAGNOSIS — R197 Diarrhea, unspecified: Secondary | ICD-10-CM | POA: Insufficient documentation

## 2017-11-07 DIAGNOSIS — J8410 Pulmonary fibrosis, unspecified: Secondary | ICD-10-CM | POA: Insufficient documentation

## 2017-11-07 DIAGNOSIS — R000 Tachycardia, unspecified: Secondary | ICD-10-CM | POA: Diagnosis present

## 2017-11-07 DIAGNOSIS — B370 Candidal stomatitis: Secondary | ICD-10-CM | POA: Diagnosis present

## 2017-11-07 DIAGNOSIS — K219 Gastro-esophageal reflux disease without esophagitis: Secondary | ICD-10-CM | POA: Diagnosis not present

## 2017-11-07 DIAGNOSIS — J9601 Acute respiratory failure with hypoxia: Secondary | ICD-10-CM | POA: Diagnosis not present

## 2017-11-07 DIAGNOSIS — E871 Hypo-osmolality and hyponatremia: Secondary | ICD-10-CM | POA: Diagnosis present

## 2017-11-07 LAB — CBC
HCT: 38 % — ABNORMAL LOW (ref 39.0–52.0)
HEMOGLOBIN: 12.4 g/dL — AB (ref 13.0–17.0)
MCH: 29.1 pg (ref 26.0–34.0)
MCHC: 32.6 g/dL (ref 30.0–36.0)
MCV: 89.2 fL (ref 78.0–100.0)
PLATELETS: 243 10*3/uL (ref 150–400)
RBC: 4.26 MIL/uL (ref 4.22–5.81)
RDW: 12.2 % (ref 11.5–15.5)
WBC: 10.9 10*3/uL — ABNORMAL HIGH (ref 4.0–10.5)

## 2017-11-07 LAB — BASIC METABOLIC PANEL
ANION GAP: 9 (ref 5–15)
Anion gap: 7 (ref 5–15)
Anion gap: 6 (ref 5–15)
BUN: 9 mg/dL (ref 6–20)
BUN: 9 mg/dL (ref 6–20)
BUN: 8 mg/dL (ref 6–20)
CALCIUM: 8.4 mg/dL — AB (ref 8.9–10.3)
CALCIUM: 8.5 mg/dL — AB (ref 8.9–10.3)
CO2: 26 mmol/L (ref 22–32)
CO2: 25 mmol/L (ref 22–32)
CO2: 28 mmol/L (ref 22–32)
CREATININE: 0.9 mg/dL (ref 0.61–1.24)
Calcium: 8.6 mg/dL — ABNORMAL LOW (ref 8.9–10.3)
Chloride: 89 mmol/L — ABNORMAL LOW (ref 101–111)
Chloride: 103 mmol/L (ref 101–111)
Chloride: 103 mmol/L (ref 101–111)
Creatinine, Ser: 1.16 mg/dL (ref 0.61–1.24)
Creatinine, Ser: 0.89 mg/dL (ref 0.61–1.24)
GFR calc Af Amer: >60 mL/min (ref >60)
GFR calc Af Amer: >60 mL/min (ref >60)
GFR calc non Af Amer: >60 mL/min (ref >60)
GLUCOSE: 139 mg/dL — AB (ref 65–99)
Glucose, Bld: 803 mg/dL (ref 65–99)
Glucose, Bld: 197 mg/dL — ABNORMAL HIGH (ref 65–99)
Potassium: 3.6 mmol/L (ref 3.5–5.1)
Potassium: 3.4 mmol/L — ABNORMAL LOW (ref 3.5–5.1)
Potassium: 3.3 mmol/L — ABNORMAL LOW (ref 3.5–5.1)
SODIUM: 124 mmol/L — AB (ref 135–145)
Sodium: 135 mmol/L (ref 135–145)
Sodium: 137 mmol/L (ref 135–145)

## 2017-11-07 LAB — CBG MONITORING, ED: Glucose-Capillary: 215 mg/dL — ABNORMAL HIGH (ref 65–99)

## 2017-11-07 LAB — I-STAT CG4 LACTIC ACID, ED: Lactic Acid, Venous: 2.29 mmol/L (ref 0.5–1.9)

## 2017-11-07 LAB — GLUCOSE, CAPILLARY: Glucose-Capillary: 97 mg/dL (ref 65–99)

## 2017-11-07 LAB — I-STAT TROPONIN, ED: TROPONIN I, POC: 0 ng/mL (ref 0.00–0.08)

## 2017-11-07 LAB — D-DIMER, QUANTITATIVE (NOT AT ARMC): D DIMER QUANT: 0.6 ug{FEU}/mL — AB (ref 0.00–0.50)

## 2017-11-07 MED ORDER — TRAMADOL HCL 50 MG PO TABS
50.0000 mg | ORAL_TABLET | Freq: Once | ORAL | Status: AC
  Administered 2017-11-07: 50 mg via ORAL
  Filled 2017-11-07: qty 1

## 2017-11-07 MED ORDER — PANCRELIPASE (LIP-PROT-AMYL) 12000-38000 UNITS PO CPEP
24000.0000 [IU] | ORAL_CAPSULE | ORAL | Status: DC | PRN
  Administered 2017-11-07: 24000 [IU] via ORAL
  Filled 2017-11-07: qty 2

## 2017-11-07 MED ORDER — TOBRAMYCIN 300 MG/5ML IN NEBU
300.0000 mg | INHALATION_SOLUTION | Freq: Two times a day (BID) | RESPIRATORY_TRACT | Status: DC
  Administered 2017-11-08: 300 mg via RESPIRATORY_TRACT
  Filled 2017-11-07 (×2): qty 5

## 2017-11-07 MED ORDER — DEXTROSE 5 % IV SOLN
2.0000 g | Freq: Two times a day (BID) | INTRAVENOUS | Status: DC
  Administered 2017-11-08: 2 g via INTRAVENOUS
  Filled 2017-11-07: qty 2

## 2017-11-07 MED ORDER — ACETAMINOPHEN 325 MG PO TABS: 650.0000 mg | ORAL_TABLET | Freq: Four times a day (QID) | ORAL | Status: DC | PRN

## 2017-11-07 MED ORDER — SODIUM CHLORIDE 7 % IN NEBU: 4.0000 mL | INHALATION_SOLUTION | Freq: Every day | RESPIRATORY_TRACT | Status: DC

## 2017-11-07 MED ORDER — VANCOMYCIN HCL 10 G IV SOLR
1500.0000 mg | Freq: Once | INTRAVENOUS | Status: AC
  Administered 2017-11-07: 1500 mg via INTRAVENOUS
  Filled 2017-11-07: qty 1500

## 2017-11-07 MED ORDER — SODIUM CHLORIDE 0.9 % IV SOLN
INTRAVENOUS | Status: DC
  Administered 2017-11-07: 21:00:00 via INTRAVENOUS

## 2017-11-07 MED ORDER — ALBUTEROL SULFATE (2.5 MG/3ML) 0.083% IN NEBU: 2.5000 mg | INHALATION_SOLUTION | Freq: Four times a day (QID) | RESPIRATORY_TRACT | Status: DC | PRN

## 2017-11-07 MED ORDER — IOPAMIDOL (ISOVUE-370) INJECTION 76%
INTRAVENOUS | Status: AC
  Administered 2017-11-07: 100 mL via INTRAVENOUS
  Filled 2017-11-07: qty 100

## 2017-11-07 MED ORDER — ENSURE ENLIVE PO LIQD
237.0000 mL | Freq: Two times a day (BID) | ORAL | Status: DC
  Administered 2017-11-08: 237 mL via ORAL

## 2017-11-07 MED ORDER — SODIUM CHLORIDE 0.9 % IV SOLN
INTRAVENOUS | Status: DC
  Filled 2017-11-07: qty 1

## 2017-11-07 MED ORDER — INSULIN ASPART 100 UNIT/ML ~~LOC~~ SOLN
10.0000 [IU] | Freq: Once | SUBCUTANEOUS | Status: AC
  Administered 2017-11-07: 10 [IU] via SUBCUTANEOUS
  Filled 2017-11-07: qty 1

## 2017-11-07 MED ORDER — ENOXAPARIN SODIUM 40 MG/0.4ML ~~LOC~~ SOLN
40.0000 mg | SUBCUTANEOUS | Status: DC
  Administered 2017-11-07: 40 mg via SUBCUTANEOUS
  Filled 2017-11-07: qty 0.4

## 2017-11-07 MED ORDER — SODIUM CHLORIDE 0.9 % IV BOLUS (SEPSIS)
1000.0000 mL | Freq: Once | INTRAVENOUS | Status: AC
  Administered 2017-11-07: 1000 mL via INTRAVENOUS

## 2017-11-07 MED ORDER — NYSTATIN 100000 UNIT/ML MT SUSP
5.0000 mL | Freq: Four times a day (QID) | OROMUCOSAL | Status: DC
  Administered 2017-11-07 – 2017-11-08 (×2): 500000 [IU] via ORAL
  Filled 2017-11-07 (×4): qty 5

## 2017-11-07 MED ORDER — INSULIN GLARGINE 100 UNIT/ML ~~LOC~~ SOLN
20.0000 [IU] | Freq: Every day | SUBCUTANEOUS | Status: DC
  Administered 2017-11-07: 20 [IU] via SUBCUTANEOUS
  Filled 2017-11-07: qty 0.2

## 2017-11-07 MED ORDER — INSULIN ASPART 100 UNIT/ML ~~LOC~~ SOLN
0.0000 [IU] | Freq: Three times a day (TID) | SUBCUTANEOUS | Status: DC
  Administered 2017-11-08 (×2): 3 [IU] via SUBCUTANEOUS

## 2017-11-07 MED ORDER — PANCRELIPASE (LIP-PROT-AMYL) 12000-38000 UNITS PO CPEP
36000.0000 [IU] | ORAL_CAPSULE | Freq: Three times a day (TID) | ORAL | Status: DC
  Administered 2017-11-08 (×2): 36000 [IU] via ORAL
  Filled 2017-11-07 (×2): qty 3

## 2017-11-07 MED ORDER — VANCOMYCIN HCL IN DEXTROSE 750-5 MG/150ML-% IV SOLN
750.0000 mg | Freq: Three times a day (TID) | INTRAVENOUS | Status: DC
  Administered 2017-11-08: 750 mg via INTRAVENOUS
  Filled 2017-11-07 (×2): qty 150

## 2017-11-07 MED ORDER — PANTOPRAZOLE SODIUM 40 MG PO TBEC
40.0000 mg | DELAYED_RELEASE_TABLET | Freq: Every day | ORAL | Status: DC
  Administered 2017-11-08: 40 mg via ORAL
  Filled 2017-11-07: qty 1

## 2017-11-07 MED ORDER — AZTREONAM LYSINE 75 MG IN SOLR: Freq: Three times a day (TID) | RESPIRATORY_TRACT | Status: DC

## 2017-11-07 MED ORDER — POTASSIUM CHLORIDE 10 MEQ/100ML IV SOLN
10.0000 meq | INTRAVENOUS | Status: AC
  Administered 2017-11-07: 10 meq via INTRAVENOUS
  Filled 2017-11-07 (×2): qty 100

## 2017-11-07 MED ORDER — POTASSIUM CHLORIDE 10 MEQ/100ML IV SOLN
10.0000 meq | Freq: Once | INTRAVENOUS | Status: AC
  Administered 2017-11-07: 10 meq via INTRAVENOUS

## 2017-11-07 MED ORDER — DEXTROSE-NACL 5-0.45 % IV SOLN: INTRAVENOUS | Status: DC

## 2017-11-07 MED ORDER — DEXTROSE 5 % IV SOLN
2.0000 g | Freq: Once | INTRAVENOUS | Status: AC
  Administered 2017-11-07: 2 g via INTRAVENOUS
  Filled 2017-11-07: qty 2

## 2017-11-07 NOTE — ED Notes (Signed)
Paged MD about step down bed?

## 2017-11-07 NOTE — ED Notes (Signed)
Dr. Fredderick PhenixBelfi made aware of elevated lactic acid.

## 2017-11-07 NOTE — ED Notes (Signed)
Lab results reported to Nurse in Triage. 

## 2017-11-07 NOTE — H&P (Signed)
History and Physical    Clinton Mitchell Mitchell DOB: 1976-05-17 DOA: 11/07/2017  PCP: System, Pcp Not In Patient coming from: home  Chief Complaint: persistent cough/hemoptysis/chest pain  HPI: Clinton SchullerRobert Mitchell is a very pleasant 41 y.o. male with medical history significant for cystic fibrosis, diabetes insulin-dependent, presents to the emergency department with the chief complaint of persistent cough and hemoptysis and chest pain. Initial evaluation includes chest x-ray concerning for community-acquired pneumonia and reveals serum glucose of greater than 800. Hospitalists are asked to admit  Formation is obtained from the chart and the patient. He states he's in the process of relocating to [place] from [place]. He does not have PCP here yet. Over the last 2 weeks he has had persistent productive cough with blood-tinged sputum, chest pain subjective fever. He states he went to urgent care and received antibiotics twice as well as steroids. Associated symptoms include chest pain particularly with cough as well as a decreased appetite. He has been compliant with his Levemir but has not been taking his meal coverage for checking his blood sugar. He has noted frequent urination. He also complains of oral thrush with sore throat. No difficulty chewing or swallowing. He denies headache dizziness syncope or near-syncope. He denies shortness of breath palpitations lower extremity edema abdominal pain nausea vomiting. He denies dysuria hematuria. Denies diarrhea constipation melena bright red blood per rectum.  ED Course: In the emergency department he's afebrile hemodynamically stable with some tachycardia is not hypoxic. He is provided with 2 L of normal saline antibiotics for community-acquired pneumonia  Review of Systems: As per HPI otherwise all other systems reviewed and are negative.   Ambulatory Status: Ambulates independently is independent with ADLs  Past Medical History:  Diagnosis  Date  . Acid reflux   . Cystic fibrosis (HCC)   . Diabetes mellitus without complication [facility](HCC)     Past Surgical History:  Procedure Laterality Date  . PANCREAS SURGERY    . WRIST SURGERY Right     Social History   Socioeconomic History  . Marital status: Unknown    Spouse name: Not on file  . Number of children: Not on file  . Years of education: Not on file  . Highest education level: Not on file  Social Needs  . Financial resource strain: Not on file  . Food insecurity - worry: Not on file  . Food insecurity - inability: Not on file  . Transportation needs - medical: Not on file  . Transportation needs - non-medical: Not on file  Occupational History  . Not on file  Tobacco Use  . Smoking status: Never Smoker  . Smokeless tobacco: Never Used  Substance and Sexual Activity  . Alcohol use: Yes  . Drug use: No  . Sexual activity: Not on file  Other Topics Concern  . Not on file  Social History Narrative  . Not on file    No Known Allergies  Family History  Problem Relation Age of Onset  . Hypertension Mother     Prior to Admission medications   Medication Sig Start Date End Date Taking? Authorizing Provider  acetaminophen (TYLENOL) 325 MG tablet Take 650 mg by mouth every 6 (six) hours as needed for mild pain.   Yes [provider]  albuterol (PROVENTIL HFA;VENTOLIN HFA) 108 (90 BASE) MCG/ACT inhaler Inhale 2 puffs into the lungs every 6 (six) hours as needed for wheezing or shortness of breath.   Yes [provider]  albuterol (PROVENTIL) (2.5 MG/3ML) 0.083%  nebulizer solution Take 2.5 mg by nebulization daily.    Yes [provider]  Aztreonam Lysine (CAYSTON) 75 MG SOLR Inhale 1 mL into the lungs 3 (three) times daily. Alternate one month on, one month off with Tobramycin 04/28/17  Yes [provider]  insulin detemir (LEVEMIR) 100 UNIT/ML injection Inject 0.2 mLs (20 Units total) into the skin daily. 11/15/15  Yes Hollice Espy, MD  insulin lispro (HUMALOG) 100 UNIT/ML injection Inject 1-4 Units into the skin See admin instructions. 1 unit=BS 150-199, 2 units=BS 200-249, 3 units BS 250-299, 4 units=BS 300-349 04/30/17  Yes [provider]  Multiple Vitamin (MULTIVITAMIN WITH MINERALS) TABS tablet Take 1 tablet by mouth daily.   Yes [provider]  omeprazole (PRILOSEC) 20 MG capsule Take 20 mg by mouth daily.   Yes [provider]  Pancrelipase, Lip-Prot-Amyl, (CREON) 24000 UNITS CPEP Take 2-3 capsules by mouth See admin instructions. 3 capsules with meals and two capsules with snacks   Yes [provider]  Sodium Chloride, Inhalant, 7 % NEBU Inhale 4 mLs into the lungs daily.    Yes [provider]  tobramycin, PF, (TOBI) 300 MG/5ML nebulizer solution Inhale 5 mLs into the lungs 2 (two) times daily. Alternate one month on, one month off with [place] 04/28/17  Yes [provider]    Physical Exam: Vitals:   11/07/17 1545 11/07/17 1600 11/07/17 1615 11/07/17 1645  BP: 112/83 129/78 126/85 (!) 127/96  Pulse: (!) 108 (!) 105 (!) 105 (!) 103  Resp: 20 15 18 17   Temp:      TempSrc:      SpO2: 99% 96% 97% 95%  Weight:      Height:         General:  Appears calm and comfortable sitting up in bed watching TV in no acute distress Eyes:  PERRL, EOMI, normal lids, iris ENT:  Clear nose without drainage oropharynx with erythema and small white patches on tongue Neck:  no LAD, masses or thyromegaly Cardiovascular:  RRR, no m/r/g. No LE edema. Pulses present and palpable Respiratory: Normal effort. Sounds with good air movement slightly rough throughout no crackles no wheezes Abdomen:  soft, ntnd, positive bowel sounds but sluggish no guarding or rebounding Skin:  no rash or induration seen on limited exam Musculoskeletal:  grossly normal tone BUE/BLE, good ROM, no bony abnormality Psychiatric:  grossly normal mood and affect, speech fluent and appropriate,  AOx3 Neurologic:  CN 2-12 grossly intact, moves all extremities in coordinated fashion, sensation intact  Labs on Admission: I have personally reviewed following labs and imaging studies  CBC: Recent Labs  Lab 11/07/17 1417  WBC 10.9*  HGB 12.4*  HCT 38.0*  MCV 89.2  PLT 243   Basic Metabolic Panel: Recent Labs  Lab 11/07/17 1417  NA 124*  K 3.6  CL 89*  CO2 26  GLUCOSE 803*  BUN 9  CREATININE 1.16  CALCIUM 8.4*   GFR: Estimated Creatinine Clearance: 86.9 mL/min (by C-G formula based on SCr of 1.16 mg/dL). Liver Function Tests: No results for input(s): AST, ALT, ALKPHOS, BILITOT, PROT, ALBUMIN in the last 168 hours. No results for input(s): LIPASE, AMYLASE in the last 168 hours. No results for input(s): AMMONIA in the last 168 hours. Coagulation Profile: No results for input(s): INR, PROTIME in the last 168 hours. Cardiac Enzymes: No results for input(s): CKTOTAL, CKMB, CKMBINDEX, TROPONINI in the last 168 hours. BNP (last 3 results) No results for input(s): PROBNP in  the last 8760 hours. HbA1C: No results for input(s): HGBA1C in the last 72 hours. CBG: No results for input(s): GLUCAP in the last 168 hours. Lipid Profile: No results for input(s): CHOL, HDL, LDLCALC, TRIG, CHOLHDL, LDLDIRECT in the last 72 hours. Thyroid Function Tests: No results for input(s): TSH, T4TOTAL, FREET4, T3FREE, THYROIDAB in the last 72 hours. Anemia Panel: No results for input(s): VITAMINB12, FOLATE, FERRITIN, TIBC, IRON, RETICCTPCT in the last 72 hours. Urine analysis: No results found for: COLORURINE, APPEARANCEUR, LABSPEC, PHURINE, GLUCOSEU, HGBUR, BILIRUBINUR, KETONESUR, PROTEINUR, UROBILINOGEN, NITRITE, LEUKOCYTESUR  Creatinine Clearance: Estimated Creatinine Clearance: 86.9 mL/min (by C-G formula based on SCr of 1.16 mg/dL).  Sepsis Labs: @LABRCNTIP (procalcitonin:4,lacticidven:4) )No results found for this or any previous visit (from the past 240 hour(s)).   Radiological  Exams on Admission: Dg Chest 2 View  Result Date: 11/07/2017 CLINICAL DATA:  Chest pain, cough, and hemoptysis. History of cystic fibrosis. EXAM: CHEST  2 VIEW COMPARISON:  Chest x-ray dated November 13, 2015. FINDINGS: The cardiomediastinal silhouette is normal in size. Normal pulmonary vascularity. Diffuse bilateral nodular airspace opacities and bronchiectasis predominantly involving the bilateral upper lobes appears slightly progressed within the anterior segments bilaterally, as well as the right middle lobe. No pleural effusion or pneumothorax. No acute osseous abnormality. IMPRESSION: 1. Diffuse bilateral nodular airspace opacities and bronchiectasis predominantly involving the bilateral upper lobes appears slightly progressed within the anterior segments, as well as the right middle lobe. This could reflect worsening changes related to cystic fibrosis versus acute superimposed pneumonia. Electronically Signed   By: Obie DredgeWilliam T Derry M.D.   On: 11/07/2017 15:12    EKG: sinus tachycardia Right atrial enlargement Minimal voltage criteria for LVH, may be normal variant Borderline ECG No significant change since last tracing  Assessment/Plan Principal Problem:   CAP (community acquired pneumonia) Active Problems:   Cystic fibrosis (HCC)   Hyponatremia   Sinus tachycardia   Diabetes mellitus with hyperglycemia (HCC)   Thrush   #1. Community-acquired pneumonia. Patient with a two-week history of respiratory illness. Chest x-ray concerning for worsening cystic fibrosis versus acute superimposed pneumonia. He has a mild leukocytosis but has been on steroids recently. He is afebrile and hemodynamicly stable with a mild tachycardia and a mildly elevated lactic acid. He is provided with IV fluids and antibiotics per protocol. He is not hypoxic. He does have an elevated d-dimer so CT of the chest pending at time of admission -Admit to step down -Follow blood cultures -Obtain sputum  culture -Antibiotics per protocol -Supportive therapy  #2. Diabetes with hyperglycemia/hyponatremia. Patient with a non-anion gap hyperglycemia. Serum glucose greater than 800 on admission. Sodium is 124 potassium is 3.6. He is provided with 2 L of normal saline. Home regimen includes Levemir 20 units every morning as well as a meal coverage sliding scale. Patient reports compliance with Levemir but not the sliding scale. He also admits to dietary indiscretions -Admit to step down -Continue vigorous IV fluids -Insulin drip per glucomander protocol -bmet every 4 hours -Potassium 10 mEq IV 2 -Physician to home regimen per protocol -Obtain a hemoglobin A1c  #3. Tachycardia. Likely related to above. EKG as noted above. -Continue IV fluids -Monitor -If no improvement consider an echocardiogram tomorrow  #4. Thrush. Likely related to previous antibiotics and steroid use. -nystatin    DVT prophylaxis: lovenox Code Status: full  Family Communication: none present  Disposition Plan: home hopefully 24-36 hours  Consults called: none  Admission status: inpatient    Gwenyth BenderBLACK,KAREN M MD [facility]  If 7PM-7AM, please contact night-coverage [email or web address] Password [facility]  11/07/2017, 5:28 PM

## 2017-11-07 NOTE — ED Provider Notes (Signed)
[facility] Provider Note   CSN: [id number] Arrival date & time: 11/07/17  1401     History   Chief Complaint Chief Complaint  Patient presents with  . Cough  . Chest Pain  . Shortness of Breath    HPI Clinton Mitchell is a 41 y.o. male.  Patient with history of cystic fibrosis presents with approximately 2-week history of productive cough with specks of blood, chest pain, subjective fever, increased urination.  Patient knew that he was getting a lung infection and went to an outside urgent care.  He was given an unknown antibiotic which he took as well as steroids without improvement.  Patient then returned and had an additional course of steroids with another antibiotic which he did not complete because it made him feel bad.  Chest pains have become worse over the past several days.  He is also noted thrush develop in his mouth and complains of a burning pain in his throat.  Patient denies any significant shortness of breath.  Patient takes Levemir for control of his blood sugars.  Denies dysuria or hematuria pain in his groin after urinating.  No nausea, vomiting, diarrhea.  No calf tenderness or history of blood clot.  No abdominal pain. The onset of this condition was acute.      Past Medical History:  Diagnosis Date  . Acid reflux   . Cystic fibrosis (HCC)   . Diabetes mellitus without complication [facility](HCC)     Patient Active Problem List   Diagnosis Date Noted  . CAP (community acquired pneumonia) 11/13/2015  . Community acquired pneumonia 11/13/2015  . Cystic fibrosis (HCC) 11/13/2015  . Hyponatremia 11/13/2015  . Sinus tachycardia 11/13/2015  . Diabetes mellitus with hyperglycemia (HCC) 11/13/2015  . Diarrhea 11/13/2015  . History of herpes simplex infection 11/13/2015  . Acute respiratory failure with hypoxemia [facility](HCC)     Past Surgical History:  Procedure Laterality Date  . PANCREAS SURGERY    . WRIST SURGERY Right         Home Medications    Prior to Admission medications   Medication Sig Start Date End Date Taking? Authorizing Provider  acetaminophen (TYLENOL) 325 MG tablet Take 650 mg by mouth every 6 (six) hours as needed for mild pain.   Yes [provider]  albuterol (PROVENTIL HFA;VENTOLIN HFA) 108 (90 BASE) MCG/ACT inhaler Inhale 2 puffs into the lungs every 6 (six) hours as needed for wheezing or shortness of breath.   Yes [provider]  albuterol (PROVENTIL) (2.5 MG/3ML) 0.083% nebulizer solution Take 2.5 mg by nebulization daily.    Yes [provider]  Aztreonam Lysine (CAYSTON) 75 MG SOLR Inhale 1 mL into the lungs 3 (three) times daily. Alternate one month on, one month off with Tobramycin 04/28/17  Yes [provider]  insulin detemir (LEVEMIR) 100 UNIT/ML injection Inject 0.2 mLs (20 Units total) into the skin daily. 11/15/15  Yes Hollice EspyKrishnan, Sendil K, MD  insulin lispro (HUMALOG) 100 UNIT/ML injection Inject 1-4 Units into the skin See admin instructions. 1 unit=BS 150-199, 2 units=BS 200-249, 3 units BS 250-299, 4 units=BS 300-349 04/30/17  Yes [provider]  Multiple Vitamin (MULTIVITAMIN WITH MINERALS) TABS tablet Take 1 tablet by mouth daily.   Yes [provider]  omeprazole (PRILOSEC) 20 MG capsule Take 20 mg by mouth daily.   Yes [provider]  Pancrelipase, Lip-Prot-Amyl, (CREON) 24000 UNITS CPEP Take 2-3 capsules by mouth See admin instructions. 3 capsules with  meals and two capsules with snacks   Yes [provider]  Sodium Chloride, Inhalant, 7 % NEBU Inhale 4 mLs into the lungs daily.    Yes [provider]  tobramycin, PF, (TOBI) 300 MG/5ML nebulizer solution Inhale 5 mLs into the lungs 2 (two) times daily. Alternate one month on, one month off with [place] 04/28/17  Yes [provider]    Family History No family history on file.  Social History Social History   Tobacco Use  .  Smoking status: Never Smoker  . Smokeless tobacco: Never Used  Substance Use Topics  . Alcohol use: Yes  . Drug use: No     Allergies   Patient has no known allergies.   Review of Systems Review of Systems  Constitutional: Positive for fever (Subjective). Negative for chills.  HENT: Positive for sore throat. Negative for congestion and rhinorrhea.   Eyes: Negative for redness.  Respiratory: Positive for cough. Negative for shortness of breath.   Cardiovascular: Positive for chest pain.  Gastrointestinal: Negative for abdominal pain, diarrhea, nausea and vomiting.  Genitourinary: Negative for dysuria.  Musculoskeletal: Negative for myalgias.  Skin: Negative for rash.  Neurological: Negative for headaches.     Physical Exam Updated Vital Signs BP 129/78   Pulse (!) 105   Temp 98.5 F (36.9 C) (Oral)   Resp 15   Ht 5\' 11"  (1.803 m)   Wt 72.6 kg (160 lb)   SpO2 96%   BMI 22.32 kg/m   Physical Exam  Constitutional: He appears well-developed and well-nourished.  HENT:  Head: Normocephalic and atraumatic.  Right Ear: Hearing, tympanic membrane and external ear normal.  Left Ear: Hearing, tympanic membrane and external ear normal.  Nose: Nose normal. No mucosal edema or rhinorrhea.  Mouth/Throat: Uvula is midline. Posterior oropharyngeal erythema present.  Thrush noted on tongue and posterior oropharynx.  Eyes: Conjunctivae are normal. Right eye exhibits no discharge. Left eye exhibits no discharge.  Neck: Normal range of motion. Neck supple.  Cardiovascular: Regular rhythm, normal heart sounds and normal pulses. Tachycardia present.  Mild tachycardia  Pulmonary/Chest: Effort normal and breath sounds normal. He has no decreased breath sounds. He has no wheezes. He has no rhonchi. He has no rales.  Abdominal: Soft. There is no tenderness.  Musculoskeletal:       Right lower leg: He exhibits no tenderness and no edema.  Neurological: He is alert.  Skin: Skin is warm  and dry.  Psychiatric: He has a normal mood and affect.  Nursing note and vitals reviewed.    ED Treatments / Results  Labs (all labs ordered are listed, but only abnormal results are displayed) Labs Reviewed  BASIC METABOLIC PANEL - Abnormal; Notable for the following components:      Result Value   Sodium 124 (*)    Chloride 89 (*)    Glucose, Bld 803 (*)    Calcium 8.4 (*)    All other components within normal limits  CBC - Abnormal; Notable for the following components:   WBC 10.9 (*)    Hemoglobin 12.4 (*)    HCT 38.0 (*)    All other components within normal limits  I-STAT CG4 LACTIC ACID, ED - Abnormal; Notable for the following components:   Lactic Acid, Venous 2.29 (*)    All other components within normal limits  CULTURE, BLOOD (ROUTINE X 2)  CULTURE, BLOOD (ROUTINE X 2)  RESPIRATORY PANEL BY PCR  D-DIMER, QUANTITATIVE (NOT [facility])  I-STAT TROPONIN,  ED    EKG  EKG Interpretation  Date/Time:  Monday November 07 2017 14:09:13 EST Ventricular Rate:  113 PR Interval:  144 QRS Duration: 80 QT Interval:  336 QTC Calculation: 460 R Axis:   69 Text Interpretation:  Sinus tachycardia Right atrial enlargement Minimal voltage criteria for LVH, may be normal variant Borderline ECG No significant change since last tracing Confirmed by Melene Plan [phone]) on 11/07/2017 3:15:10 PM       Radiology Dg Chest 2 View  Result Date: 11/07/2017 CLINICAL DATA:  Chest pain, cough, and hemoptysis. History of cystic fibrosis. EXAM: CHEST  2 VIEW COMPARISON:  Chest x-ray dated November 13, 2015. FINDINGS: The cardiomediastinal silhouette is normal in size. Normal pulmonary vascularity. Diffuse bilateral nodular airspace opacities and bronchiectasis predominantly involving the bilateral upper lobes appears slightly progressed within the anterior segments bilaterally, as well as the right middle lobe. No pleural effusion or pneumothorax. No acute osseous abnormality. IMPRESSION: 1.  Diffuse bilateral nodular airspace opacities and bronchiectasis predominantly involving the bilateral upper lobes appears slightly progressed within the anterior segments, as well as the right middle lobe. This could reflect worsening changes related to cystic fibrosis versus acute superimposed pneumonia. Electronically Signed   By: Obie Dredge M.D.   On: 11/07/2017 15:12    Procedures Procedures (including critical care time)  Medications Ordered in ED Medications  sodium chloride 0.9 % bolus 1,000 mL (1,000 mLs Intravenous New Bag/Given 11/07/17 1602)  sodium chloride 0.9 % bolus 1,000 mL (1,000 mLs Intravenous New Bag/Given 11/07/17 1601)  ceFEPIme (MAXIPIME) 2 g in dextrose 5 % 50 mL IVPB (2 g Intravenous New Bag/Given 11/07/17 1607)  vancomycin (VANCOCIN) 1,500 mg in sodium chloride 0.9 % 500 mL IVPB (not administered)     Initial Impression / Assessment and Plan / ED Course  I have reviewed the triage vital signs and the nursing notes.  Pertinent labs & imaging results that were available during my care of the patient were reviewed by me and considered in my medical decision making (see chart for details).     Patient seen and examined. Work-up initiated. Medications ordered. Discussed with Dr. Adela Lank.   Vital signs reviewed and are as follows: BP 129/78   Pulse (!) 105   Temp 98.5 F (36.9 C) (Oral)   Resp 15   Ht 5\' 11"  (1.803 m)   Wt 72.6 kg (160 lb)   SpO2 96%   BMI 22.32 kg/m   Discussed antibiotic coverage with pharmacy.  Given previous sputum cultures, vancomycin and cefepime should be a good starting point.  Patient has grown out pan positive Pseudomonas and stenotrophomonas in the past.   Spoke with Toya Smothers NP who will see/admit. Blood sugar improving.   Final Clinical Impressions(s) / ED Diagnoses   Final diagnoses:  Community acquired pneumonia, unspecified laterality  Cystic fibrosis (HCC)  Hyperglycemia without ketosis   Admit.   ED Discharge  Orders    None       Renne Crigler, Cordelia Poche 11/07/17 2337    Melene Plan, DO 11/08/17 1459

## 2017-11-07 NOTE — ED Notes (Signed)
Per Lab BMP was contaminated new order placed   Needs to be recollected

## 2017-11-07 NOTE — ED Notes (Signed)
Paged MD Dartha Lodgegbata

## 2017-11-07 NOTE — ED Notes (Signed)
Repeat page to MD.

## 2017-11-07 NOTE — Progress Notes (Signed)
Pharmacy Antibiotic Note  Clinton SchullerRobert Mitchell is a 41 y.o. male admitted on 11/07/2017 withpneumonia. Pt with hx of CF and previous resp cultures sx for pan sensitive pseudomonas and stenotrophomonas. Pharmacy has been consulted for cefepime and vancomycin dosing.  Plan: 1. Vancomycin 1500 mg IV x 1 now followed by 750 mg IV every 8 hours starting on 12/18 am  2. Cefepime 2 grams IV every 12 hours  Height: 5\' 11"  (180.3 cm) Weight: 160 lb (72.6 kg) IBW/kg (Calculated) : 75.3  Temp (24hrs), Avg:98.5 F (36.9 C), Min:98.5 F (36.9 C), Max:98.5 F (36.9 C)  Recent Labs  Lab 11/07/17 1417 11/07/17 1433  WBC 10.9*  --   CREATININE 1.16  --   LATICACIDVEN  --  2.29*    Estimated Creatinine Clearance: 86.9 mL/min (by C-G formula based on SCr of 1.16 mg/dL).    No Known Allergies  Antimicrobials this admission: 12/17 cefepime >>  12/17 vancomycin >>   Microbiology results: Px  Thank you for allowing pharmacy to be a part of this patient's care.  Clinton SamplesAndy Tanor Mitchell, PharmD, BCPS 11/07/2017, 4:39 PM

## 2017-11-07 NOTE — ED Triage Notes (Addendum)
States has had a bad cough painful x 2-3 weeks has been given  X 2  antibiotics and steriods but not helping and having cp sputum has blood in it, has hx of CF and has also  Been peeing a lot and is thirsty and has white on tongue, is diabteic and has not been checking his sugars

## 2017-11-07 NOTE — ED Notes (Signed)
Patient transported to CT 

## 2017-11-08 DIAGNOSIS — J189 Pneumonia, unspecified organism: Secondary | ICD-10-CM | POA: Diagnosis not present

## 2017-11-08 LAB — CBC
HCT: 35.8 % — ABNORMAL LOW (ref 39.0–52.0)
HEMOGLOBIN: 11.7 g/dL — AB (ref 13.0–17.0)
MCH: 28.6 pg (ref 26.0–34.0)
MCHC: 32.7 g/dL (ref 30.0–36.0)
MCV: 87.5 fL (ref 78.0–100.0)
Platelets: 248 10*3/uL (ref 150–400)
RBC: 4.09 MIL/uL — AB (ref 4.22–5.81)
RDW: 11.7 % (ref 11.5–15.5)
WBC: 6.7 10*3/uL (ref 4.0–10.5)

## 2017-11-08 LAB — RESPIRATORY PANEL BY PCR
ADENOVIRUS-RVPPCR: NOT DETECTED
BORDETELLA PERTUSSIS-RVPCR: NOT DETECTED
CHLAMYDOPHILA PNEUMONIAE-RVPPCR: NOT DETECTED
Coronavirus 229E: NOT DETECTED
Coronavirus HKU1: NOT DETECTED
Coronavirus NL63: NOT DETECTED
Coronavirus OC43: NOT DETECTED
INFLUENZA A-RVPPCR: NOT DETECTED
Influenza B: NOT DETECTED
METAPNEUMOVIRUS-RVPPCR: NOT DETECTED
Mycoplasma pneumoniae: NOT DETECTED
PARAINFLUENZA VIRUS 1-RVPPCR: NOT DETECTED
PARAINFLUENZA VIRUS 2-RVPPCR: NOT DETECTED
PARAINFLUENZA VIRUS 4-RVPPCR: NOT DETECTED
Parainfluenza Virus 3: NOT DETECTED
RESPIRATORY SYNCYTIAL VIRUS-RVPPCR: NOT DETECTED
RHINOVIRUS / ENTEROVIRUS - RVPPCR: NOT DETECTED

## 2017-11-08 LAB — BASIC METABOLIC PANEL
ANION GAP: 6 (ref 5–15)
ANION GAP: 8 (ref 5–15)
Anion gap: 6 (ref 5–15)
BUN: 9 mg/dL (ref 6–20)
BUN: 9 mg/dL (ref 6–20)
BUN: 9 mg/dL (ref 6–20)
CALCIUM: 8 mg/dL — AB (ref 8.9–10.3)
CALCIUM: 8.2 mg/dL — AB (ref 8.9–10.3)
CHLORIDE: 101 mmol/L (ref 101–111)
CO2: 25 mmol/L (ref 22–32)
CO2: 25 mmol/L (ref 22–32)
CO2: 26 mmol/L (ref 22–32)
CREATININE: 0.85 mg/dL (ref 0.61–1.24)
Calcium: 8.2 mg/dL — ABNORMAL LOW (ref 8.9–10.3)
Chloride: 101 mmol/L (ref 101–111)
Chloride: 100 mmol/L — ABNORMAL LOW (ref 101–111)
Creatinine, Ser: 0.91 mg/dL (ref 0.61–1.24)
Creatinine, Ser: 0.95 mg/dL (ref 0.61–1.24)
GFR calc Af Amer: >60 mL/min (ref >60)
GFR calc Af Amer: >60 mL/min (ref >60)
GFR calc Af Amer: >60 mL/min (ref >60)
GFR calc non Af Amer: >60 mL/min (ref >60)
Glucose, Bld: 266 mg/dL — ABNORMAL HIGH (ref 65–99)
Glucose, Bld: 247 mg/dL — ABNORMAL HIGH (ref 65–99)
Glucose, Bld: 215 mg/dL — ABNORMAL HIGH (ref 65–99)
POTASSIUM: 3.4 mmol/L — AB (ref 3.5–5.1)
POTASSIUM: 3.5 mmol/L (ref 3.5–5.1)
Potassium: 3.2 mmol/L — ABNORMAL LOW (ref 3.5–5.1)
SODIUM: 132 mmol/L — AB (ref 135–145)
SODIUM: 133 mmol/L — AB (ref 135–145)
Sodium: 133 mmol/L — ABNORMAL LOW (ref 135–145)

## 2017-11-08 LAB — URINALYSIS, ROUTINE W REFLEX MICROSCOPIC
Bacteria, UA: NONE SEEN
Bilirubin Urine: NEGATIVE
Glucose, UA: >=500 mg/dL — AB
Hgb urine dipstick: NEGATIVE
Ketones, ur: 5 mg/dL — AB
Leukocytes, UA: NEGATIVE
Nitrite: NEGATIVE
Protein, ur: NEGATIVE mg/dL
Specific Gravity, Urine: 1.041 — ABNORMAL HIGH (ref 1.005–1.030)
Squamous Epithelial / LPF: NONE SEEN
pH: 5 (ref 5.0–8.0)

## 2017-11-08 LAB — GLUCOSE, CAPILLARY
Glucose-Capillary: 228 mg/dL — ABNORMAL HIGH (ref 65–99)
Glucose-Capillary: 243 mg/dL — ABNORMAL HIGH (ref 65–99)

## 2017-11-08 LAB — HIV ANTIBODY (ROUTINE TESTING W REFLEX): HIV SCREEN 4TH GENERATION: NONREACTIVE

## 2017-11-08 LAB — MRSA PCR SCREENING: MRSA by PCR: POSITIVE — AB

## 2017-11-08 MED ORDER — INSULIN GLARGINE 100 UNIT/ML ~~LOC~~ SOLN: 25.0000 [IU] | Freq: Every day | SUBCUTANEOUS | Status: DC

## 2017-11-08 MED ORDER — CEFPODOXIME PROXETIL 200 MG PO TABS
200.0000 mg | ORAL_TABLET | Freq: Two times a day (BID) | ORAL | Status: DC
  Administered 2017-11-08: 200 mg via ORAL
  Filled 2017-11-08: qty 1

## 2017-11-08 MED ORDER — INSULIN LISPRO 100 UNIT/ML ~~LOC~~ SOLN: 0.0000 [IU] | SUBCUTANEOUS | 0 refills | Status: DC

## 2017-11-08 MED ORDER — INSULIN DETEMIR 100 UNIT/ML ~~LOC~~ SOLN: 25.0000 [IU] | Freq: Every day | SUBCUTANEOUS | 0 refills | Status: AC

## 2017-11-08 MED ORDER — POTASSIUM CHLORIDE CRYS ER 20 MEQ PO TBCR
40.0000 meq | EXTENDED_RELEASE_TABLET | Freq: Once | ORAL | Status: AC
  Administered 2017-11-08: 40 meq via ORAL
  Filled 2017-11-08: qty 2

## 2017-11-08 MED ORDER — CEFPODOXIME PROXETIL 200 MG PO TABS
200.0000 mg | ORAL_TABLET | Freq: Two times a day (BID) | ORAL | 0 refills | Status: AC
Start: 2017-11-08 — End: 2017-11-15

## 2017-11-08 MED ORDER — INSULIN ASPART 100 UNIT/ML ~~LOC~~ SOLN
4.0000 [IU] | Freq: Three times a day (TID) | SUBCUTANEOUS | Status: DC
  Administered 2017-11-08: 4 [IU] via SUBCUTANEOUS

## 2017-11-08 NOTE — Progress Notes (Signed)
IV discontinued with cannula intact.  Tolerated well.  Discharge instructions given and reviewed with pt.  Given opportunity to ask questions and have them answered.  Denies questions.  Given prescriptions. Work note obtained from physician for pt.  Pt eating lunch and will call when is ready to go out after dressing.

## 2017-11-08 NOTE — Discharge Summary (Signed)
Physician Discharge Summary  Jonathin Heinicke AVW:[id number] DOB: [birth year] DOA: 11/07/2017  PCP: System, Pcp Not In  Admit date: 11/07/2017 Discharge date: 11/08/2017  Admitted From: Home Disposition:  Home   Recommendations for Outpatient Follow-up:  1. Follow up with PCP in 1 week. Patient to establish with PCP in area.  2. Please obtain BMP in 1 week to ensure stability of potassium.  Hypokalemia had been replaced prior to discharge. 3. Patient needs better diabetic control.  His insulin regimen was adjusted prior to discharge. 4. Please follow up on the following pending results: Final blood culture results, negative at time of discharge  Discharge Condition: Stable CODE STATUS: Full  Diet recommendation: Heart healthy, carb modified   Brief/Interim Summary: HPI: Francisca Harbuck is a very pleasant 41 y.o. male with medical history significant for cystic fibrosis, diabetes insulin-dependent, presents to the emergency department with the chief complaint of persistent cough and hemoptysis and chest pain. Initial evaluation includes chest x-ray concerning for community-acquired pneumonia and reveals serum glucose of greater than 800. Hospitalists are asked to admit  History is obtained from the chart and the patient. He states he's in the process of relocating to [place] from [place]. He does not have PCP here yet. Over the last 2 weeks he has had persistent productive cough with blood-tinged sputum, chest pain subjective fever. He states he went to urgent care and received antibiotics twice as well as steroids. Associated symptoms include chest pain particularly with cough as well as a decreased appetite. He has been compliant with his Levemir but has not been taking his meal coverage for checking his blood sugar. He has noted frequent urination. He also complains of oral thrush with sore throat. No difficulty chewing or swallowing. He denies headache dizziness syncope or near-syncope. He  denies shortness of breath palpitations lower extremity edema abdominal pain nausea vomiting. He denies dysuria hematuria. Denies diarrhea constipation melena bright red blood per rectum.  ED Course: In the emergency department he's afebrile hemodynamically stable with some tachycardia is not hypoxic. He is provided with 2 L of normal saline antibiotics for community-acquired pneumonia.  Interim: Patient was empirically treated with vancomycin, cefepime.  Blood sugars were better controlled in the hospital.  On day of discharge, patient was on room air, lungs were clear to auscultation bilaterally.  Patient had clinically improved, and patient wanted to go home.  His antibiotic was de-escalated to oral Vantin on discharge.  He is to establish with primary care physician in the area for follow-up.  Discharge Diagnoses:  Principal Problem:   CAP (community acquired pneumonia) Active Problems:   Cystic fibrosis (HCC)   Hyponatremia   Sinus tachycardia   Diabetes mellitus with hyperglycemia (HCC)   Thrush   Hyperglycemia without ketosis   Community-acquired pneumonia. Patient with a two-week history of respiratory illness. Chest x-ray concerning for worsening cystic fibrosis versus acute superimposed pneumonia. He has a mild leukocytosis but has been on steroids recently. He is afebrile and hemodynamicaly stable with a mild tachycardia and a mildly elevated lactic acid. -Clinically improved. Deescalate antibiotics to oral Vantin on discharge  Diabetes with hyperglycemia. Patient with a non-anion gap hyperglycemia. Serum glucose greater than 800 on admission. Patient reports compliance with Levemir but not the sliding scale -Discharged with increase in Levemir dose to 25 units daily as well as to continue sliding scale insulin.  Tachycardia. Likely related to above.  -Resolved   Thrush. Likely related to previous antibiotics and steroid use. -Nystatin  Hypokalemia -Replaced Klor  Con       Discharge Instructions  Discharge Instructions    Call MD for:  difficulty breathing, headache or visual disturbances   Complete by:  As directed    Call MD for:  extreme fatigue   Complete by:  As directed    Call MD for:  persistant dizziness or light-headedness   Complete by:  As directed    Call MD for:  persistant nausea and vomiting   Complete by:  As directed    Call MD for:  severe uncontrolled pain   Complete by:  As directed    Call MD for:  temperature >100.4   Complete by:  As directed    Diet Carb Modified   Complete by:  As directed    Discharge instructions   Complete by:  As directed    You were cared for by a hospitalist during your hospital stay. If you have any questions about your discharge medications or the care you received while you were in the hospital after you are discharged, you can call the unit and asked to speak with the hospitalist on call if the hospitalist that took care of you is not available. Once you are discharged, your primary care physician will handle any further medical issues. Please note that NO REFILLS for any discharge medications will be authorized once you are discharged, as it is imperative that you return to your primary care physician (or establish a relationship with a primary care physician if you do not have one) for your aftercare needs so that they can reassess your need for medications and monitor your lab values.   Increase activity slowly   Complete by:  As directed      Allergies as of 11/08/2017   No Known Allergies     Medication List    TAKE these medications   acetaminophen 325 MG tablet Commonly known as:  TYLENOL Take 650 mg by mouth every 6 (six) hours as needed for mild pain.   albuterol (2.5 MG/3ML) 0.083% nebulizer solution Commonly known as:  PROVENTIL Take 2.5 mg by nebulization daily.   albuterol 108 (90 Base) MCG/ACT inhaler Commonly known as:  PROVENTIL HFA;VENTOLIN HFA Inhale 2 puffs into  the lungs every 6 (six) hours as needed for wheezing or shortness of breath.   CAYSTON 75 MG Solr Generic drug:  Aztreonam Lysine Inhale 1 mL into the lungs 3 (three) times daily. Alternate one month on, one month off with Tobramycin   cefpodoxime 200 MG tablet Commonly known as:  VANTIN Take 1 tablet (200 mg total) by mouth every 12 (twelve) hours for 7 days.   CREON 24000-76000 units Cpep Generic drug:  Pancrelipase (Lip-Prot-Amyl) Take 2-3 capsules by mouth See admin instructions. 3 capsules with meals and two capsules with snacks   insulin detemir 100 UNIT/ML injection Commonly known as:  LEVEMIR Inject 0.25 mLs (25 Units total) into the skin daily. What changed:  how much to take   insulin lispro 100 UNIT/ML injection Commonly known as:  HUMALOG Inject 0-0.09 mLs (0-9 Units total) into the skin See admin instructions. 1 unit=BS 121-150, 2 units=BS 151-200, 3 units BS 201-250, 5 units=BS 251-300, 7 units=BS 301-350, 9 units=351-400 What changed:    how much to take  additional instructions   multivitamin with minerals Tabs tablet Take 1 tablet by mouth daily.   omeprazole 20 MG capsule Commonly known as:  PRILOSEC Take 20 mg by mouth daily.   Sodium Chloride (Inhalant) 7 %  Nebu Inhale 4 mLs into the lungs daily.   TOBI 300 MG/5ML nebulizer solution Generic drug:  tobramycin (PF) Inhale 5 mLs into the lungs 2 (two) times daily. Alternate one month on, one month off with [place]      Follow-up Information    [facility] Follow up on 11/18/2017.   Specialty:  Internal Medicine Why:  hospital follow up at 9 am , establish as new patient Contact information: [address] [place] [address] [phone]         No Known Allergies  Consultations:  None   Procedures/Studies: Dg Chest 2 View  Result Date: 11/07/2017 CLINICAL DATA:  Chest pain, cough, and hemoptysis. History of cystic fibrosis. EXAM: CHEST  2 VIEW  COMPARISON:  Chest x-ray dated November 13, 2015. FINDINGS: The cardiomediastinal silhouette is normal in size. Normal pulmonary vascularity. Diffuse bilateral nodular airspace opacities and bronchiectasis predominantly involving the bilateral upper lobes appears slightly progressed within the anterior segments bilaterally, as well as the right middle lobe. No pleural effusion or pneumothorax. No acute osseous abnormality. IMPRESSION: 1. Diffuse bilateral nodular airspace opacities and bronchiectasis predominantly involving the bilateral upper lobes appears slightly progressed within the anterior segments, as well as the right middle lobe. This could reflect worsening changes related to cystic fibrosis versus acute superimposed pneumonia. Electronically Signed   By: Obie Dredge M.D.   On: 11/07/2017 15:12   Ct Angio Chest Pe W And/or Wo Contrast  Result Date: 11/07/2017 CLINICAL DATA:  Cough and hemoptysis.  History of cystic fibrosis. EXAM: CT ANGIOGRAPHY CHEST WITH CONTRAST TECHNIQUE: Multidetector CT imaging of the chest was performed using the standard protocol during bolus administration of intravenous contrast. Multiplanar CT image reconstructions and MIPs were obtained to evaluate the vascular anatomy. CONTRAST:  ISOVUE-370 IOPAMIDOL (ISOVUE-370) INJECTION 76% COMPARISON:  CT chest dated November 13, 2015. FINDINGS: Cardiovascular: Satisfactory opacification of the pulmonary arteries to the segmental level. No evidence of pulmonary embolism. Normal heart size. No pericardial effusion. Normal caliber thoracic aorta. Mediastinum/Nodes: Prominent, borderline enlarged mediastinal and bilateral hilar lymph nodes are similar to prior study, and favored reactive. No axillary lymphadenopathy. Thyroid gland, trachea, and esophagus demonstrate no significant findings. Lungs/Pleura: Extensive bilateral, upper lobe predominant bronchiectasis and peribronchial thickening with diffuse tree-in-bud nodularity  and small areas of more peripheral, consolidative opacities involving both lungs. Scattered areas of airways impaction. Unchanged 2.4 cm nodular density in the right lung apex, stable since 2016 and likely representing scar. No pleural effusion or pneumothorax. Upper Abdomen: No acute abnormality. Musculoskeletal: Bilateral gynecomastia. No acute or significant osseous findings. Review of the MIP images confirms the above findings. IMPRESSION: 1. No evidence of pulmonary embolism. 2. Sequela of cystic fibrosis with superimposed multifocal bronchopneumonia involving both lungs. Electronically Signed   By: Obie Dredge M.D.   On: 11/07/2017 18:36       Discharge Exam: Vitals:   11/08/17 1156 11/08/17 1200  BP: 100/65 114/74  Pulse: 97 97  Resp: 19 (!) 22  Temp: 98 F (36.7 C)   SpO2: 98% 97%   Vitals:   11/08/17 0850 11/08/17 0935 11/08/17 1156 11/08/17 1200  BP: 107/79  100/65 114/74  Pulse: 96  97 97  Resp: 18  19 (!) 22  Temp: 97.8 F (36.6 C)  98 F (36.7 C)   TempSrc: Oral  Oral   SpO2: 97% 100% 98% 97%  Weight:      Height:  General: Pt is alert, awake, not in acute distress Cardiovascular: RRR, S1/S2 +, no rubs, no gallops Respiratory: CTA bilaterally, no wheezing, no rhonchi Abdominal: Soft, NT, ND, bowel sounds + Extremities: no edema, no cyanosis    The results of significant diagnostics from this hospitalization (including imaging, microbiology, ancillary and laboratory) are listed below for reference.     Microbiology: Recent Results (from the past 240 hour(s))  Blood culture (routine x 2)     Status: None (Preliminary result)   Collection Time: 11/07/17  3:52 PM  Result Value Ref Range Status   Specimen Description BLOOD LEFT ANTECUBITAL  Final   Special Requests IN PEDIATRIC BOTTLE Blood Culture adequate volume  Final   Culture NO GROWTH < 24 HOURS  Final   Report Status PENDING  Incomplete  Blood culture (routine x 2)     Status: None  (Preliminary result)   Collection Time: 11/07/17  3:52 PM  Result Value Ref Range Status   Specimen Description BLOOD BLOOD LEFT FOREARM  Final   Special Requests IN PEDIATRIC BOTTLE Blood Culture adequate volume  Final   Culture NO GROWTH < 24 HOURS  Final   Report Status PENDING  Incomplete  Respiratory Panel by PCR     Status: None   Collection Time: 11/07/17  5:29 PM  Result Value Ref Range Status   Adenovirus NOT DETECTED NOT DETECTED Final   Coronavirus 229E NOT DETECTED NOT DETECTED Final   Coronavirus HKU1 NOT DETECTED NOT DETECTED Final   Coronavirus NL63 NOT DETECTED NOT DETECTED Final   Coronavirus OC43 NOT DETECTED NOT DETECTED Final   Metapneumovirus NOT DETECTED NOT DETECTED Final   Rhinovirus / Enterovirus NOT DETECTED NOT DETECTED Final   Influenza A NOT DETECTED NOT DETECTED Final   Influenza B NOT DETECTED NOT DETECTED Final   Parainfluenza Virus 1 NOT DETECTED NOT DETECTED Final   Parainfluenza Virus 2 NOT DETECTED NOT DETECTED Final   Parainfluenza Virus 3 NOT DETECTED NOT DETECTED Final   Parainfluenza Virus 4 NOT DETECTED NOT DETECTED Final   Respiratory Syncytial Virus NOT DETECTED NOT DETECTED Final   Bordetella pertussis NOT DETECTED NOT DETECTED Final   Chlamydophila pneumoniae NOT DETECTED NOT DETECTED Final   Mycoplasma pneumoniae NOT DETECTED NOT DETECTED Final  MRSA PCR Screening     Status: Abnormal   Collection Time: 11/07/17  8:10 PM  Result Value Ref Range Status   MRSA by PCR POSITIVE (A) NEGATIVE Final    Comment:        The GeneXpert MRSA Assay (FDA approved for NASAL specimens only), is one component of a comprehensive MRSA colonization surveillance program. It is not intended to diagnose MRSA infection nor to guide or monitor treatment for MRSA infections. RESULT CALLED TO, READ BACK BY AND VERIFIED WITH: D.OWEN,RN AT 0026 BY L.PITT 11/08/17      Labs: BNP (last 3 results) No results for input(s): BNP in the last 8760  hours. Basic Metabolic Panel: Recent Labs  Lab 11/07/17 1903 11/07/17 2007 11/08/17 0030 11/08/17 0344 11/08/17 0733  NA 135 137 132* 133* 133*  K 3.4* 3.3* 3.5 3.4* 3.2*  CL 103 103 101 100* 101  CO2 25 28 25 25 26   GLUCOSE 197* 139* 266* 247* 215*  BUN 9 8 9 9 9   CREATININE 0.89 0.90 0.91 0.95 0.85  CALCIUM 8.6* 8.5* 8.0* 8.2* 8.2*   Liver Function Tests: No results for input(s): AST, ALT, ALKPHOS, BILITOT, PROT, ALBUMIN in the last 168 hours. No results  for input(s): LIPASE, AMYLASE in the last 168 hours. No results for input(s): AMMONIA in the last 168 hours. CBC: Recent Labs  Lab 11/07/17 1417 11/08/17 0850  WBC 10.9* 6.7  HGB 12.4* 11.7*  HCT 38.0* 35.8*  MCV 89.2 87.5  PLT 243 248   Cardiac Enzymes: No results for input(s): CKTOTAL, CKMB, CKMBINDEX, TROPONINI in the last 168 hours. BNP: Invalid input(s): POCBNP CBG: Recent Labs  Lab 11/07/17 1828 11/07/17 2143 11/08/17 0854 11/08/17 1240  GLUCAP 215* 97 228* 243*   D-Dimer Recent Labs    11/07/17 1552  DDIMER 0.60*   Hgb A1c No results for input(s): HGBA1C in the last 72 hours. Lipid Profile No results for input(s): CHOL, HDL, LDLCALC, TRIG, CHOLHDL, LDLDIRECT in the last 72 hours. Thyroid function studies No results for input(s): TSH, T4TOTAL, T3FREE, THYROIDAB in the last 72 hours.  Invalid input(s): FREET3 Anemia work up No results for input(s): VITAMINB12, FOLATE, FERRITIN, TIBC, IRON, RETICCTPCT in the last 72 hours. Urinalysis    Component Value Date/Time   COLORURINE YELLOW 11/07/2017 1819   APPEARANCEUR CLEAR 11/07/2017 1819   LABSPEC 1.041 (H) 11/07/2017 1819   PHURINE 5.0 11/07/2017 1819   GLUCOSEU >=500 (A) 11/07/2017 1819   HGBUR NEGATIVE 11/07/2017 1819   BILIRUBINUR NEGATIVE 11/07/2017 1819   KETONESUR 5 (A) 11/07/2017 1819   PROTEINUR NEGATIVE 11/07/2017 1819   NITRITE NEGATIVE 11/07/2017 1819   LEUKOCYTESUR NEGATIVE 11/07/2017 1819   Sepsis Labs Invalid input(s):  PROCALCITONIN,  WBC,  LACTICIDVEN Microbiology Recent Results (from the past 240 hour(s))  Blood culture (routine x 2)     Status: None (Preliminary result)   Collection Time: 11/07/17  3:52 PM  Result Value Ref Range Status   Specimen Description BLOOD LEFT ANTECUBITAL  Final   Special Requests IN PEDIATRIC BOTTLE Blood Culture adequate volume  Final   Culture NO GROWTH < 24 HOURS  Final   Report Status PENDING  Incomplete  Blood culture (routine x 2)     Status: None (Preliminary result)   Collection Time: 11/07/17  3:52 PM  Result Value Ref Range Status   Specimen Description BLOOD BLOOD LEFT FOREARM  Final   Special Requests IN PEDIATRIC BOTTLE Blood Culture adequate volume  Final   Culture NO GROWTH < 24 HOURS  Final   Report Status PENDING  Incomplete  Respiratory Panel by PCR     Status: None   Collection Time: 11/07/17  5:29 PM  Result Value Ref Range Status   Adenovirus NOT DETECTED NOT DETECTED Final   Coronavirus 229E NOT DETECTED NOT DETECTED Final   Coronavirus HKU1 NOT DETECTED NOT DETECTED Final   Coronavirus NL63 NOT DETECTED NOT DETECTED Final   Coronavirus OC43 NOT DETECTED NOT DETECTED Final   Metapneumovirus NOT DETECTED NOT DETECTED Final   Rhinovirus / Enterovirus NOT DETECTED NOT DETECTED Final   Influenza A NOT DETECTED NOT DETECTED Final   Influenza B NOT DETECTED NOT DETECTED Final   Parainfluenza Virus 1 NOT DETECTED NOT DETECTED Final   Parainfluenza Virus 2 NOT DETECTED NOT DETECTED Final   Parainfluenza Virus 3 NOT DETECTED NOT DETECTED Final   Parainfluenza Virus 4 NOT DETECTED NOT DETECTED Final   Respiratory Syncytial Virus NOT DETECTED NOT DETECTED Final   Bordetella pertussis NOT DETECTED NOT DETECTED Final   Chlamydophila pneumoniae NOT DETECTED NOT DETECTED Final   Mycoplasma pneumoniae NOT DETECTED NOT DETECTED Final  MRSA PCR Screening     Status: Abnormal   Collection Time: 11/07/17  8:10  PM  Result Value Ref Range Status   MRSA by PCR  POSITIVE (A) NEGATIVE Final    Comment:        The GeneXpert MRSA Assay (FDA approved for NASAL specimens only), is one component of a comprehensive MRSA colonization surveillance program. It is not intended to diagnose MRSA infection nor to guide or monitor treatment for MRSA infections. RESULT CALLED TO, READ BACK BY AND VERIFIED WITH: D.OWEN,RN AT 0026 BY L.PITT 11/08/17      Time coordinating discharge: 40 minutes  SIGNED:  Noralee StainJennifer Riven Beebe, DO [facility] Pager [phone]  If 7PM-7AM, please contact night-coverage [email or web address] Password [email or web address] 11/08/2017, 3:29 PM

## 2017-11-08 NOTE — Care Management Note (Addendum)
Case Management Note  Patient Details  Name: Clinton SchullerRobert Mitchell MRN: [id number] Date of Birth: 14-Sep-1976  Subjective/Objective:     From home, pta indep, presents with PNA, follow up apt made with patient care center.  For dc today. [place] will go back [place] [facility] to get his medications since he has Medicaid of [place].               Action/Plan: NCM will follow for dc needs.  Expected Discharge Date:  11/08/17               Expected Discharge Plan:  Home/Self Care  In-House Referral:     Discharge planning Services  CM Consult, Follow-up appt scheduled  Post Acute Care Choice:    Choice offered to:     DME Arranged:    DME Agency:     HH Arranged:    HH Agency:     Status of Service:  Completed, signed off  If discussed at [employer] Length of Stay Meetings, dates discussed:    Additional Comments:  Clinton Havenaylor, Clinton Schrom Clinton, RN 11/08/2017, 1:11 PM

## 2017-11-08 NOTE — Progress Notes (Signed)
Pt in the process of [place]oc[place] to [place] and needs new PCP here. RN placed order for case management to assist.

## 2017-11-08 NOTE — Progress Notes (Addendum)
Inpatient Diabetes Program Recommendations  AACE/ADA: New Consensus Statement on Inpatient Glycemic Control (2015)  Target Ranges:  Prepandial:   less than 140 mg/dL      Peak postprandial:   less than 180 mg/dL (1-2 hours)      Critically ill patients:  140 - 180 mg/dL   Results for [name], Clinton Mitchell (MRN [id number]) as of 11/08/2017 09:07  Ref. Range 11/07/2017 18:28 11/07/2017 21:43 11/08/2017 08:54  Glucose-Capillary Latest Ref Range: 65 - 99 mg/dL 644215 (H) 97 034228 (H)    Admit with: Pneumonia/ Hyperglycemia   History: DM, Cystic Fibrosis  Home DM Meds: Levemir 20 units daily       Humalog 1-4 units TID  Current Insulin Orders: Lantus 25 units QHS      Novolog Sensitive Correction Scale/ SSI (0-9 units) TID AC        MD- Note Lantus increased to 25 units QHS today.  Will get increased dose tonight.  May also consider starting Novolog Meal Coverage:  Novolog 4 units TID with meals (hold if pt eats <50% of meal)     --Will follow patient during hospitalization--  Ambrose FinlandJeannine Johnston Marlet Korte RN, MSN, CDE Diabetes Coordinator Inpatient Glycemic Control Team Team Pager: [phone] (8a-5p)

## 2017-11-09 LAB — HEMOGLOBIN A1C
HEMOGLOBIN A1C: 10.1 % — AB (ref 4.8–5.6)
Mean Plasma Glucose: 243 mg/dL

## 2017-11-12 LAB — CULTURE, BLOOD (ROUTINE X 2)
CULTURE: NO GROWTH
Culture: NO GROWTH
SPECIAL REQUESTS: ADEQUATE
SPECIAL REQUESTS: ADEQUATE

## 2017-11-18 ENCOUNTER — Ambulatory Visit: Payer: Medicaid - Out of State | Admitting: Family Medicine

## 2018-04-08 ENCOUNTER — Encounter (HOSPITAL_COMMUNITY): Payer: Self-pay | Admitting: Emergency Medicine

## 2018-04-08 ENCOUNTER — Emergency Department (HOSPITAL_COMMUNITY)
Admission: EM | Admit: 2018-04-08 | Discharge: 2018-04-09 | Disposition: A | Discharge status: Home / Self Care | Payer: Medicaid - Out of State | Attending: Emergency Medicine | Admitting: Emergency Medicine

## 2018-04-08 DIAGNOSIS — F410 Panic disorder [episodic paroxysmal anxiety] without agoraphobia: Secondary | ICD-10-CM | POA: Diagnosis not present

## 2018-04-08 DIAGNOSIS — R45851 Suicidal ideations: Secondary | ICD-10-CM | POA: Diagnosis not present

## 2018-04-08 DIAGNOSIS — E119 Type 2 diabetes mellitus without complications: Secondary | ICD-10-CM | POA: Insufficient documentation

## 2018-04-08 DIAGNOSIS — R0789 Other chest pain: Secondary | ICD-10-CM | POA: Diagnosis not present

## 2018-04-08 DIAGNOSIS — Z794 Long term (current) use of insulin: Secondary | ICD-10-CM | POA: Diagnosis not present

## 2018-04-08 DIAGNOSIS — F331 Major depressive disorder, recurrent, moderate: Secondary | ICD-10-CM | POA: Diagnosis not present

## 2018-04-08 DIAGNOSIS — Z79899 Other long term (current) drug therapy: Secondary | ICD-10-CM | POA: Insufficient documentation

## 2018-04-08 DIAGNOSIS — F419 Anxiety disorder, unspecified: Secondary | ICD-10-CM

## 2018-04-08 MED ORDER — PANCRELIPASE (LIP-PROT-AMYL) 36000-114000 UNITS PO CPEP
72000.0000 [IU] | ORAL_CAPSULE | Freq: Three times a day (TID) | ORAL | Status: DC
  Administered 2018-04-09 (×2): 72000 [IU] via ORAL
  Filled 2018-04-08 (×3): qty 2

## 2018-04-08 MED ORDER — PANTOPRAZOLE SODIUM 40 MG PO TBEC
40.0000 mg | DELAYED_RELEASE_TABLET | Freq: Every day | ORAL | Status: DC
  Administered 2018-04-09: 40 mg via ORAL
  Filled 2018-04-08: qty 1

## 2018-04-08 MED ORDER — SODIUM CHLORIDE 3 % IN NEBU
4.0000 mL | INHALATION_SOLUTION | Freq: Every day | RESPIRATORY_TRACT | Status: DC
  Administered 2018-04-09: 4 mL via RESPIRATORY_TRACT
  Filled 2018-04-08: qty 4

## 2018-04-08 MED ORDER — ALBUTEROL SULFATE (2.5 MG/3ML) 0.083% IN NEBU
2.5000 mg | INHALATION_SOLUTION | Freq: Every day | RESPIRATORY_TRACT | Status: DC
  Administered 2018-04-09: 2.5 mg via RESPIRATORY_TRACT
  Filled 2018-04-08: qty 3

## 2018-04-08 MED ORDER — PANCRELIPASE (LIP-PROT-AMYL) 12000-38000 UNITS PO CPEP: 48000.0000 [IU] | ORAL_CAPSULE | ORAL | Status: DC | PRN

## 2018-04-08 MED ORDER — INSULIN DETEMIR 100 UNIT/ML ~~LOC~~ SOLN
20.0000 [IU] | Freq: Every day | SUBCUTANEOUS | Status: DC
  Administered 2018-04-09: 20 [IU] via SUBCUTANEOUS
  Filled 2018-04-08: qty 0.2

## 2018-04-08 MED ORDER — HYDROXYZINE HCL 25 MG PO TABS
25.0000 mg | ORAL_TABLET | Freq: Once | ORAL | Status: AC
  Administered 2018-04-09: 25 mg via ORAL
  Filled 2018-04-08: qty 1

## 2018-04-08 NOTE — ED Triage Notes (Signed)
Per EMS pt has had intermittent "spells" this week but at work today about 1830 began having shortness of breath, tearful, anxious, shaking.  Pt has had multiple family stressors this week.  Talking in complete sentences.

## 2018-04-09 ENCOUNTER — Encounter (HOSPITAL_COMMUNITY): Payer: Self-pay | Admitting: Registered Nurse

## 2018-04-09 ENCOUNTER — Other Ambulatory Visit: Payer: Self-pay

## 2018-04-09 ENCOUNTER — Emergency Department (HOSPITAL_COMMUNITY): Payer: Medicaid - Out of State

## 2018-04-09 LAB — COMPREHENSIVE METABOLIC PANEL
ALBUMIN: 3.7 g/dL (ref 3.5–5.0)
ALK PHOS: 97 U/L (ref 38–126)
ALT: 21 U/L (ref 17–63)
ANION GAP: 11 (ref 5–15)
AST: 26 U/L (ref 15–41)
BUN: 12 mg/dL (ref 6–20)
CO2: 25 mmol/L (ref 22–32)
Calcium: 8.9 mg/dL (ref 8.9–10.3)
Chloride: 100 mmol/L — ABNORMAL LOW (ref 101–111)
Creatinine, Ser: 1.12 mg/dL (ref 0.61–1.24)
GFR calc Af Amer: >60 mL/min (ref >60)
GFR calc non Af Amer: >60 mL/min (ref >60)
Glucose, Bld: 242 mg/dL — ABNORMAL HIGH (ref 65–99)
Potassium: 3.2 mmol/L — ABNORMAL LOW (ref 3.5–5.1)
SODIUM: 136 mmol/L (ref 135–145)
Total Bilirubin: 1.2 mg/dL (ref 0.3–1.2)
Total Protein: 7.4 g/dL (ref 6.5–8.1)

## 2018-04-09 LAB — CBC
HEMATOCRIT: 39.6 % (ref 39.0–52.0)
HEMOGLOBIN: 12.6 g/dL — AB (ref 13.0–17.0)
MCH: 27.2 pg (ref 26.0–34.0)
MCHC: 31.8 g/dL (ref 30.0–36.0)
MCV: 85.5 fL (ref 78.0–100.0)
PLATELETS: 216 10*3/uL (ref 150–400)
RBC: 4.63 MIL/uL (ref 4.22–5.81)
RDW: 13.2 % (ref 11.5–15.5)
WBC: 7.6 10*3/uL (ref 4.0–10.5)

## 2018-04-09 LAB — RAPID URINE DRUG SCREEN, HOSP PERFORMED
Amphetamines: NOT DETECTED
BARBITURATES: NOT DETECTED
BENZODIAZEPINES: NOT DETECTED
COCAINE: NOT DETECTED
OPIATES: NOT DETECTED
Tetrahydrocannabinol: NOT DETECTED

## 2018-04-09 LAB — ETHANOL

## 2018-04-09 LAB — I-STAT TROPONIN, ED: TROPONIN I, POC: 0 ng/mL (ref 0.00–0.08)

## 2018-04-09 LAB — CBG MONITORING, ED: Glucose-Capillary: 301 mg/dL — ABNORMAL HIGH (ref 65–99)

## 2018-04-09 LAB — SALICYLATE LEVEL: Salicylate Lvl: <7 mg/dL (ref 2.8–30.0)

## 2018-04-09 LAB — ACETAMINOPHEN LEVEL: Acetaminophen (Tylenol), Serum: <10 ug/mL — ABNORMAL LOW (ref 10–30)

## 2018-04-09 MED ORDER — AMOXICILLIN-POT CLAVULANATE 875-125 MG PO TABS: 1.0000 | ORAL_TABLET | Freq: Two times a day (BID) | ORAL | 0 refills | Status: AC

## 2018-04-09 MED ORDER — DOXYCYCLINE HYCLATE 100 MG PO CAPS: 100.0000 mg | ORAL_CAPSULE | Freq: Two times a day (BID) | ORAL | 0 refills | Status: AC

## 2018-04-09 MED ORDER — POTASSIUM CHLORIDE CRYS ER 20 MEQ PO TBCR
20.0000 meq | EXTENDED_RELEASE_TABLET | Freq: Once | ORAL | Status: AC
  Administered 2018-04-09: 20 meq via ORAL
  Filled 2018-04-09: qty 1

## 2018-04-09 MED ORDER — HYDROXYZINE HCL 10 MG PO TABS: 20.0000 mg | ORAL_TABLET | Freq: Three times a day (TID) | ORAL | 0 refills | Status: DC | PRN

## 2018-04-09 MED ORDER — DOXYCYCLINE HYCLATE 100 MG PO TABS
100.0000 mg | ORAL_TABLET | Freq: Two times a day (BID) | ORAL | Status: DC
  Administered 2018-04-09 (×2): 100 mg via ORAL
  Filled 2018-04-09 (×2): qty 1

## 2018-04-09 MED ORDER — POTASSIUM CHLORIDE CRYS ER 20 MEQ PO TBCR: 20.0000 meq | EXTENDED_RELEASE_TABLET | Freq: Every day | ORAL | Status: DC

## 2018-04-09 MED ORDER — DOXYCYCLINE HYCLATE 100 MG PO CAPS: 100.0000 mg | ORAL_CAPSULE | Freq: Two times a day (BID) | ORAL | 0 refills | Status: DC

## 2018-04-09 MED ORDER — AMOXICILLIN-POT CLAVULANATE 875-125 MG PO TABS: 1.0000 | ORAL_TABLET | Freq: Two times a day (BID) | ORAL | 0 refills | Status: DC

## 2018-04-09 MED ORDER — AMOXICILLIN-POT CLAVULANATE 875-125 MG PO TABS
1.0000 | ORAL_TABLET | Freq: Two times a day (BID) | ORAL | Status: DC
  Administered 2018-04-09 (×2): 1 via ORAL
  Filled 2018-04-09 (×2): qty 1

## 2018-04-09 NOTE — Discharge Instructions (Addendum)
You are prescribed Augmentin and Doxycycline for changes in the lung tissue. We would like to cover you for any lung infections before you develop any issues. You will take doxycycline and Augmentin for 7 days.  He will take a total of 14 doses of each of these medications.  If you are in the hospital longer than today, you may subtract those doses from your total antibiotic course.   Please take all of your antibiotics until finished.   You may develop abdominal discomfort or nausea from the antibiotic. If this occurs, you may take it with food. Some patients also get diarrhea with antibiotics. You may help offset this with probiotics which you can buy or get in yogurt. Do not eat or take the probiotics until 2 hours after your antibiotic. Some women develop vaginal yeast infections after antibiotics. If you develop unusual vaginal discharge after being on this medication, please see your primary care provider.   Some people develop allergies to antibiotics. Symptoms of antibiotic allergy can be mild and include a flat rash and itching. They can also be more serious and include:  ?Hives - Hives are raised, red patches of skin that are usually very itchy.  ?Lip or tongue swelling  ?Trouble swallowing or breathing  ?Blistering of the skin or mouth.  If you have any of these serious symptoms, please seek emergency medical care immediately.  Doxycycline can make you sensitive to sunlight, so please wear hats and good coverage in the sun while taking.  Please return to the emergency department if you develop any chest pain, shortness of breath or dizziness or lightheadedness where you feel you are going to pass out.  Please return to the ED for any thoughts that you want to harm yourself.

## 2018-04-09 NOTE — ED Notes (Signed)
Urine specimen cup placed at bedside.

## 2018-04-09 NOTE — ED Provider Notes (Signed)
[facility] EMERGENCY DEPARTMENT Provider Note   CSN: [id number] Arrival date & time: 04/08/18  1956     History   Chief Complaint Chief Complaint  Patient presents with  . Panic Attack    HPI Clinton Mitchell is a 42 y.o. male.  HPI   Patient is a 42 year old male with a history of cystic fibrosis (on daily saline nebulized treatments, albuterol, and Creon), diabetes mellitus, on Levemir only, and acid reflux presenting for chest tightness,  feelings of "panic", and suicidal ideations.  Patient reports that he experienced this while he was at work between 6 and 7 PM earlier this evening.  Patient reports that the sensation he experienced in his chest was identical to prior episodes of panic attacks that he was previously diagnosed with.  Patient reports that he was supposed to be followed for mental health in [place], [place], however he was unable to receive follow-up.  Patient reports similar episodes occurring in 2014, 2015, 2017.  Patient reports that he also experienced facial paresthesias and paresthesias down bilateral extremities that have resolved.  Patient denies any shortness of breath or palpitations during this interval.  Patient reports that he was previous he diagnosed with cardiac angina, but is not on any medications for such.  Patient denies any personal or family history of early MI.  Patient denies any history of DVT/PE, hormone use, recent immobilization, hospitalization, recent surgery, or cancer treatment.  Patient is also noting that earlier this week, he began having persistent thoughts of suicidal ideation.  Patient reports last week that his girlfriend broke up with him, and she was his only source of support here in [place].  Patient describes wanting to overdose on Tylenol, but not attempting this.  No prior history of suicidal attempts.  Of note, patient is also denying any fevers, chills, increasing cough or increasing sputum production.   Patient is reporting compliance with his daily nebulizer treatments, but is not sure where he is in his every other month cycle of tobramycin.  Patient is followed with pulmonology at the [facility]  Past Medical History:  Diagnosis Date  . Acid reflux   . Cystic fibrosis (HCC)   . Diabetes mellitus without complication [facility])     Patient Active Problem List   Diagnosis Date Noted  . Thrush 11/07/2017  . Hyperglycemia without ketosis   . CAP (community acquired pneumonia) 11/13/2015  . Community acquired pneumonia 11/13/2015  . Cystic fibrosis (HCC) 11/13/2015  . Hyponatremia 11/13/2015  . Sinus tachycardia 11/13/2015  . Diabetes mellitus with hyperglycemia (HCC) 11/13/2015  . Diarrhea 11/13/2015  . History of herpes simplex infection 11/13/2015  . Acute respiratory failure with hypoxemia [facility])     Past Surgical History:  Procedure Laterality Date  . PANCREAS SURGERY    . WRIST SURGERY Right         Home Medications    Prior to Admission medications   Medication Sig Start Date End Date Taking? Authorizing Provider  acetaminophen (TYLENOL) 325 MG tablet Take 650 mg by mouth every 6 (six) hours as needed for mild pain.   Yes [provider]  albuterol (PROVENTIL HFA;VENTOLIN HFA) 108 (90 BASE) MCG/ACT inhaler Inhale 2 puffs into the lungs every 6 (six) hours as needed for wheezing or shortness of breath.   Yes [provider]  albuterol (PROVENTIL) (2.5 MG/3ML) 0.083% nebulizer solution Take 2.5 mg by nebulization daily.    Yes [provider]  insulin detemir (LEVEMIR)  100 UNIT/ML injection Inject 0.25 mLs (25 Units total) into the skin daily. 11/08/17  Yes Noralee Stain, DO  Multiple Vitamin (MULTIVITAMIN WITH MINERALS) TABS tablet Take 1 tablet by mouth daily.   Yes [provider]  omeprazole (PRILOSEC) 20 MG capsule Take 20 mg by mouth daily.   Yes [provider]  Pancrelipase, Lip-Prot-Amyl,  (CREON) 24000 UNITS CPEP Take 2-3 capsules by mouth See admin instructions. 3 capsules with meals and two capsules with snacks   Yes [provider]  Sodium Chloride, Inhalant, 7 % NEBU Inhale 4 mLs into the lungs daily.    Yes [provider]  insulin lispro (HUMALOG) 100 UNIT/ML injection Inject 0-0.09 mLs (0-9 Units total) into the skin See admin instructions. 1 unit=BS 121-150, 2 units=BS 151-200, 3 units BS 201-250, 5 units=BS 251-300, 7 units=BS 301-350, 9 units=351-400 Patient not taking: Reported on 04/08/2018 11/08/17   Noralee Stain, DO    Family History Family History  Problem Relation Age of Onset  . Hypertension Mother     Social History Social History   Tobacco Use  . Smoking status: Never Smoker  . Smokeless tobacco: Never Used  Substance Use Topics  . Alcohol use: Yes  . Drug use: No     Allergies   Patient has no known allergies.   Review of Systems Review of Systems  Constitutional: Negative for chills and fever.  Respiratory: Positive for chest tightness. Negative for cough and shortness of breath.   Cardiovascular: Negative for palpitations.  Gastrointestinal: Negative for nausea and vomiting.  Musculoskeletal: Negative for back pain.  Skin: Negative for color change.  Neurological:       +Paresthesias  Psychiatric/Behavioral: Positive for dysphoric mood and suicidal ideas. The patient is nervous/anxious.   All other systems reviewed and are negative.    Physical Exam Updated Vital Signs BP (!) 143/87 (BP Location: Right Arm)   Pulse 95   Temp 98.3 F (36.8 C) (Oral)   Resp 16   SpO2 99%   Physical Exam  Constitutional: He appears well-developed and well-nourished. No distress.  HENT:  Head: Normocephalic and atraumatic.  Mouth/Throat: Oropharynx is clear and moist.  Eyes: Pupils are equal, round, and reactive to light. Conjunctivae and EOM are normal.  Neck: Normal range of motion. Neck supple.  Cardiovascular: Normal  rate, regular rhythm, S1 normal, S2 normal and intact distal pulses.  No murmur heard. Intact, 2+ radial pulses bilaterally and equal.  Pulmonary/Chest: Effort normal.  No tachypnea.  Normal respiratory effort.  Patient exhibits diminished lung sounds in bilateral upper lung fields, but no rales  Abdominal: Soft. He exhibits no distension. There is no tenderness. There is no guarding.  Musculoskeletal: Normal range of motion. He exhibits no edema or deformity.  Lymphadenopathy:    He has no cervical adenopathy.  Neurological: He is alert.  Cranial nerves grossly intact. Patient moves extremities symmetrically and with good coordination.  Skin: Skin is warm and dry. No rash noted. No erythema.  Psychiatric: He has a normal mood and affect. His behavior is normal. Judgment and thought content normal.  Nursing note and vitals reviewed.    ED Treatments / Results  Labs (all labs ordered are listed, but only abnormal results are displayed) Labs Reviewed  ACETAMINOPHEN LEVEL - Abnormal; Notable for the following components:      Result Value   Acetaminophen (Tylenol), Serum <10 (*)    All other components within normal limits  COMPREHENSIVE METABOLIC PANEL - Abnormal; Notable for the  following components:   Potassium 3.2 (*)    Chloride 100 (*)    Glucose, Bld 242 (*)    All other components within normal limits  CBC - Abnormal; Notable for the following components:   Hemoglobin 12.6 (*)    All other components within normal limits  ETHANOL  SALICYLATE LEVEL  RAPID URINE DRUG SCREEN, HOSP PERFORMED  I-STAT TROPONIN, ED    EKG EKG Interpretation  Date/Time:  Saturday Apr 08 2018 23:47:28 EDT Ventricular Rate:  95 PR Interval:    QRS Duration: 83 QT Interval:  363 QTC Calculation: 457 R Axis:   71 Text Interpretation:  Sinus rhythm Confirmed by Zadie Rhine ([id number]) on 04/09/2018 12:13:40 AM   Radiology Dg Chest 2 View  Result Date: 04/09/2018 CLINICAL DATA:   Dyspnea, anxiety and shaking. EXAM: CHEST - 2 VIEW COMPARISON:  11/07/2017 CT FINDINGS: Heart is normal in size. No aortic aneurysm is identified. Bronchopneumonia superimposed on chronic bronchiectasis is identified in the upper lobes, right greater than left. No effusion or pneumothorax. No acute osseous abnormality. IMPRESSION: Redemonstration of extensive bronchiectasis within the lungs with superimposed confluent airspace opacities in both upper lobes, right greater than left. Findings are suspicious for changes of bronchopneumonia and mucus inspissation within bronchiectatic lung. Electronically Signed   By: Tollie Eth M.D.   On: 04/09/2018 00:53    Procedures Procedures (including critical care time)  Medications Ordered in ED Medications  albuterol (PROVENTIL) (2.5 MG/3ML) 0.083% nebulizer solution 2.5 mg (has no administration in time range)  insulin detemir (LEVEMIR) injection 20 Units (has no administration in time range)  pantoprazole (PROTONIX) EC tablet 40 mg (has no administration in time range)  lipase/protease/amylase (CREON) capsule 48,000 Units (has no administration in time range)  sodium chloride HYPERTONIC 3 % nebulizer solution 4 mL (has no administration in time range)  hydrOXYzine (ATARAX/VISTARIL) tablet 25 mg (has no administration in time range)  lipase/protease/amylase (CREON) capsule 72,000 Units (has no administration in time range)  potassium chloride SA (K-DUR,KLOR-CON) CR tablet 20 mEq (has no administration in time range)  potassium chloride SA (K-DUR,KLOR-CON) CR tablet 20 mEq (has no administration in time range)  doxycycline (VIBRA-TABS) tablet 100 mg (has no administration in time range)  amoxicillin-clavulanate (AUGMENTIN) 875-125 MG per tablet 1 tablet (has no administration in time range)     Initial Impression / Assessment and Plan / ED Course  I have reviewed the triage vital signs and the nursing notes.  Pertinent labs & imaging results that were  available during my care of the patient were reviewed by me and considered in my medical decision making (see chart for details).  Clinical Course as of Apr 09 746  Wynelle Link Apr 09, 2018  0027 TTS can speak with patient.   [AM]  0128 Reassessed.  Patient feeling well.  Discussed results including chest x-ray.  Patient denies any increased cough or sputum production.  No fever or chills recently.   [AM]  0132 Patient's chest x-ray discussed with Dr. Maurine Simmering, and plan is to cover patient for increased infiltrates with Augmentin and doxycycline for 7 days.  Do not suspect that this is acute pneumonia as cause of patient's symptoms, as he is having no increased sputum production cough, or fever chills.  Patient also does not demonstrate leukocytosis.  Will have patient closely follow with his pulmonologist.   [AM]  [phone] Spoke with Corrie Dandy of TTS who does not recommend IVC for patient, as he is not expressing active plan at  present. Appreciate her involvement in setting disposition for patient.   [AM]  0645 Reassessed patient. He is feeling well at this time.  Patient reports that he has a follow-up with his pulmonologist on May 29.   [AM]    Clinical Course User Index [AM] Elisha Ponder, PA-C    Patient nontoxic-appearing, afebrile, and in no acute distress.  Differential diagnosis includes ACS, pulmonary embolism, pneumonia, panic attack.  as patient has recurrent history of dysphoric mood, do not feel that there is an acute medical cause requiring further work-up at this time for suicidal ideations.  Initial troponin is negative, patient has normal EKG without evidence of ischemia, infarction, or arrhythmia.  Chest x-ray demonstrates possible increased infiltrates in the upper lobes, but patient has no leukocytosis, no fever chills, and no increased sputum production, shortness of breath, or cough.  Patient will likely require follow-up with his pulmonologist for this, but will cover patient  with antibiotics per discussion above with Dr. Bebe Shaggy.  Doubt pulmonary embolism, as patient is not tachycardic, is not tachypneic, endorses no shortness of breath, and has no precipitating risk factors other than chronic pulmonary disease.  Doubt thoracic aortic dissection, as patient is nontoxic-appearing, has normal vital signs, and has equal, 2+ radial pulses bilaterally.  Upon discharge, patient is to complete full 7-day course of Augmentin twice daily and doxycycline twice daily. Paper prescriptions provided for patient.  Final Clinical Impressions(s) / ED Diagnoses   Final diagnoses:  Suicidal ideation  Chest tightness  Anxiety    ED Discharge Orders        Ordered    amoxicillin-clavulanate (AUGMENTIN) 875-125 MG tablet  Every 12 hours     04/09/18 0648    doxycycline (VIBRAMYCIN) 100 MG capsule  2 times daily     04/09/18 0648       Elisha Ponder, PA-C 04/09/18 1610    Zadie Rhine, MD 04/09/18 2322

## 2018-04-09 NOTE — Care Management Note (Signed)
Case Management Note  Patient Details  Name: Zach Tietje MRN: [id number] Date of Birth: [birth year]  Subjective/Objective:       Pt recently moved here and needs medical management of cystic fibrosis and diabetes.               Action/Plan: Information placed on AVS with instructions.  [facility] info removed because patient is not in an eligible zip code.    Expected Discharge Date:                  Expected Discharge Plan:  Home/Self Care  In-House Referral:  NA  Discharge planning Services  CM Consult  Post Acute Care Choice:  NA Choice offered to:     DME Arranged:    DME Agency:     HH Arranged:    HH Agency:     Status of Service:  Completed, signed off  If discussed at [employer] of Stay Meetings, dates discussed:    Additional Comments:  Deveron Furlong, RN 04/09/2018, 9:07 AM

## 2018-04-09 NOTE — ED Notes (Signed)
Pt Lunch order placed. 

## 2018-04-09 NOTE — ED Notes (Signed)
Pt given snack. 

## 2018-04-09 NOTE — ED Provider Notes (Signed)
Patient evaluated by behavioral health and recommended for discharge. I am asked to write for vistaril 20 tid prn for 3 days until patient can followup.    Terrilee Files, MD 04/09/18 1158

## 2018-04-09 NOTE — Consult Note (Signed)
  Tele Assessment   Clinton Mitchell, 42 y.o., male patient presented to [facility] via EMS after a panic attack at work.  Patient has complaints of suicidal ideation on and off for a week and a to overdose.  Patient seen via telepsych by this provider; chart reviewed and consulted with Dr. Lucianne Muss on 04/09/18.  On evaluation Clinton Mitchell reports he came to the hospital after a panic attack yesterday at work.  Patient states that he has had panic attacks before in the past but it has been years ago; took medication for panic attacks but doesn't remember the name.  Sates that his biggest stressor at this time is that he and his fiancee is not getting along.  Patient also states that he has usually drank socially but the last week he has noticed that his alcohol consumption has increased.  He denies the use of illicit drugs.  No current UDS.  Patient states that he has had some suicidal thoughts on and off but denies current suicidal ideation and is able to contract for safety.  Patient also denies history of self-injurious behavior. Patient denies homicidal ideation, psychosis, and paranoia.  Patient denies history of inpatient or outpatient psychiatric treatment.  Patient reports that he lives with his fiancee and that he is also employed.            During evaluation Clinton Mitchell is alert/oriented x 4; calm/cooperative; and mood congruent with affect.  He does not appear to be responding to internal/external stimuli or delusional thoughts.  Patient denied suicidal/self-harm/homicidal ideation, psychosis, and paranoia.  Patient answered question appropriately.  Recommendations: Referral to Intensive Outpatient Services.  Prescription for vistaril 20 mg Tid prn anxiety.  If patient doesn't hear anything form [facility] by Monday he is to call them with contact information given  Disposition: No evidence of imminent risk to self or others at present.   Patient does not meet criteria for psychiatric  inpatient admission. Supportive therapy provided about ongoing stressors. Refer to IOP.  Spoke with Kriste Basque, RN about above recommendations/Disposition and she will inform EDP  Assunta Found, NP

## 2018-04-09 NOTE — ED Notes (Signed)
ALL belongings - 2 labeled belongings bags and 1 valuables envelope - returned to pt - Pt signed verifying all items present. Rx's x 3 given - voiced understanding of completing antibiotics and to follow up w/recommendations on d/c paperwork. Work note given as requested.
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
## 2018-04-10 ENCOUNTER — Telehealth (HOSPITAL_COMMUNITY): Payer: Self-pay | Admitting: Psychiatry

## 2018-04-21 ENCOUNTER — Encounter (HOSPITAL_COMMUNITY): Payer: Self-pay | Admitting: Nurse Practitioner

## 2018-04-21 ENCOUNTER — Emergency Department (HOSPITAL_COMMUNITY)
Admission: EM | Admit: 2018-04-21 | Discharge: 2018-04-21 | Disposition: A | Discharge status: Home / Self Care | Payer: Medicaid - Out of State | Attending: Emergency Medicine | Admitting: Emergency Medicine

## 2018-04-21 ENCOUNTER — Other Ambulatory Visit: Payer: Self-pay

## 2018-04-21 DIAGNOSIS — R45851 Suicidal ideations: Secondary | ICD-10-CM | POA: Diagnosis not present

## 2018-04-21 DIAGNOSIS — E119 Type 2 diabetes mellitus without complications: Secondary | ICD-10-CM | POA: Insufficient documentation

## 2018-04-21 DIAGNOSIS — F320 Major depressive disorder, single episode, mild: Secondary | ICD-10-CM | POA: Insufficient documentation

## 2018-04-21 LAB — COMPREHENSIVE METABOLIC PANEL
ALK PHOS: 115 U/L (ref 38–126)
ALT: 21 U/L (ref 17–63)
ANION GAP: 8 (ref 5–15)
AST: 23 U/L (ref 15–41)
Albumin: 4.2 g/dL (ref 3.5–5.0)
BUN: 18 mg/dL (ref 6–20)
CALCIUM: 9.1 mg/dL (ref 8.9–10.3)
CO2: 26 mmol/L (ref 22–32)
Chloride: 102 mmol/L (ref 101–111)
Creatinine, Ser: 0.96 mg/dL (ref 0.61–1.24)
GFR calc non Af Amer: >60 mL/min (ref >60)
Glucose, Bld: 154 mg/dL — ABNORMAL HIGH (ref 65–99)
Potassium: 3.7 mmol/L (ref 3.5–5.1)
SODIUM: 136 mmol/L (ref 135–145)
Total Bilirubin: 1.3 mg/dL — ABNORMAL HIGH (ref 0.3–1.2)
Total Protein: 8.3 g/dL — ABNORMAL HIGH (ref 6.5–8.1)

## 2018-04-21 LAB — CBC
HCT: 40.9 % (ref 39.0–52.0)
Hemoglobin: 13.6 g/dL (ref 13.0–17.0)
MCH: 27.9 pg (ref 26.0–34.0)
MCHC: 33.3 g/dL (ref 30.0–36.0)
MCV: 84 fL (ref 78.0–100.0)
PLATELETS: 247 10*3/uL (ref 150–400)
RBC: 4.87 MIL/uL (ref 4.22–5.81)
RDW: 12.9 % (ref 11.5–15.5)
WBC: 7.6 10*3/uL (ref 4.0–10.5)

## 2018-04-21 LAB — RAPID URINE DRUG SCREEN, HOSP PERFORMED
Amphetamines: NOT DETECTED
BARBITURATES: NOT DETECTED
Benzodiazepines: NOT DETECTED
COCAINE: NOT DETECTED
OPIATES: NOT DETECTED
TETRAHYDROCANNABINOL: NOT DETECTED

## 2018-04-21 LAB — SALICYLATE LEVEL

## 2018-04-21 LAB — ACETAMINOPHEN LEVEL

## 2018-04-21 LAB — ETHANOL: Alcohol, Ethyl (B): <10 mg/dL (ref <10)

## 2018-04-21 MED ORDER — CITALOPRAM HYDROBROMIDE 10 MG PO TABS
10.0000 mg | ORAL_TABLET | Freq: Every day | ORAL | Status: DC
  Administered 2018-04-21: 10 mg via ORAL

## 2018-04-21 MED ORDER — LORAZEPAM 1 MG PO TABS
1.0000 mg | ORAL_TABLET | Freq: Once | ORAL | Status: AC
  Administered 2018-04-21: 1 mg via ORAL
  Filled 2018-04-21: qty 1

## 2018-04-21 MED ORDER — HYDROXYZINE HCL 10 MG PO TABS: 10.0000 mg | ORAL_TABLET | Freq: Four times a day (QID) | ORAL | Status: DC | PRN

## 2018-04-21 MED ORDER — CITALOPRAM HYDROBROMIDE 10 MG PO TABS: 10.0000 mg | ORAL_TABLET | Freq: Every day | ORAL | 8 refills | Status: AC

## 2018-04-21 MED ORDER — HYDROXYZINE HCL 10 MG PO TABS: 10.0000 mg | ORAL_TABLET | Freq: Four times a day (QID) | ORAL | 8 refills | Status: DC | PRN

## 2018-04-21 MED ORDER — CITALOPRAM HYDROBROMIDE 10 MG PO TABS: 10.0000 mg | ORAL_TABLET | Freq: Every day | ORAL | 8 refills | Status: DC

## 2018-04-21 MED ORDER — HYDROXYZINE HCL 25 MG PO TABS: 25.0000 mg | ORAL_TABLET | Freq: Every day | ORAL | 7 refills | Status: DC

## 2018-04-21 MED ORDER — HYDROXYZINE HCL 25 MG PO TABS: 25.0000 mg | ORAL_TABLET | Freq: Four times a day (QID) | ORAL | Status: DC | PRN
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
## 2018-04-21 NOTE — BH Assessment (Addendum)
[facility] Assessment Progress Note   Case was staffed with Sharma CovertNorman DO who stated patient could be discharged after collateral information could be obtained from a relative (mother). This Clinical research associatewriter spoke with mother [place] who will transport patient back to her residence where he will temporally reside. Lord DNP also evaluated patient for medication management prior to discharge.

## 2018-04-21 NOTE — BH Assessment (Signed)
[facility] Assessment Progress Note This Clinical research associate spoke with patient's mother Nahmir Zeidman [phone] who stated she would transport patient back to her residence and patient would temporally reside with her and his father. Patient was able to contact for safety and will be discharged with OP resources.

## 2018-04-21 NOTE — [facility] Suicide Risk Assessment (Signed)
Suicide Risk Assessment  Discharge Assessment   [facility] Discharge Suicide Risk Assessment   Principal Problem:major depressive disorder, single episode, mild Discharge Diagnoses:  Patient Active Problem List   Diagnosis Date Noted  . Thrush [B37.0] 11/07/2017  . Hyperglycemia without ketosis [R73.9]   . CAP (community acquired pneumonia) [J18.9] 11/13/2015  . Community acquired pneumonia [J18.9] 11/13/2015  . Cystic fibrosis (HCC) [E84.9] 11/13/2015  . Hyponatremia [E87.1] 11/13/2015  . Sinus tachycardia [R00.0] 11/13/2015  . Diabetes mellitus with hyperglycemia (HCC) [E11.65] 11/13/2015  . Diarrhea [R19.7] 11/13/2015  . History of herpes simplex infection [Z86.19] 11/13/2015  . Acute respiratory failure with hypoxemia (HCC) [J96.01]     Total Time spent with patient: 45 minutes  Musculoskeletal: Strength & Muscle Tone: within normal limits Gait & Station: normal Patient leans: N/A  Psychiatric Specialty Exam:   Blood pressure (!) 125/92, pulse 99, temperature 98 F (36.7 C), temperature source Oral, resp. rate 20, SpO2 99 %.There is no height or weight on file to calculate BMI.  General Appearance: Casual  Eye Contact::  Good  Speech:  Normal Rate409  Volume:  Normal  Mood:  Anxious and Depressed  Affect:  Congruent  Thought Process:  Coherent and Descriptions of Associations: Intact  Orientation:  Full (Time, Place, and Person)  Thought Content:  WDL and Logical  Suicidal Thoughts:  No  Homicidal Thoughts:  No  Memory:  Immediate;   Good Recent;   Good Remote;   Good  Judgement:  Good  Insight:  Good  Psychomotor Activity:  Normal  Concentration:  Good  Recall:  Good  Fund of Knowledge:Good  Language: Good  Akathisia:  No  Handed:  Right  AIMS (if indicated):     Assets:  Housing Leisure Time Physical Health Resilience Social Support Vocational/Educational  Sleep:     Cognition: WNL  ADL's:  Intact   Mental Status Per Nursing Assessment::   On  Admission:   42 yo  Male who came to the ED after an increase in depression and anxiety due to relationship issues.  He is interested in outpatient but not inpatient.  No suicidal/homicidal ideations, hallucinations, or substance abuse.  His [place] him, no one has safety concerns (mother, father, and other family members).  He contracts for safety, Rx discussed and Celexa for depression started along with hydroxyzine 10 mg every six hours for anxiety and 25 mg for sleep (may repeat once).  Side effects explained to him and educated about taking the anxiety medicine when at home and not driving until he knew how it was going to effect him.  Sleep medicine education about no driving after taking.  Rx provided with outpatient resources.  He will go home with family, stable for discharge.  Demographic Factors:  Male  Loss Factors: NA  Historical Factors: NA  Risk Reduction Factors:   Responsible for children under 21 years of age, Sense of responsibility to family, Employed and Positive social support  Continued Clinical Symptoms:  Depression and anxiety, mild to moderate  Cognitive Features That Contribute To Risk:  None    Suicide Risk:  Minimal: No identifiable suicidal ideation.  Patients presenting with no risk factors but with morbid ruminations; may be classified as minimal risk based on the severity of the depressive symptoms    Plan Of Care/Follow-up recommendations:  Activity:  as tolerated Diet:  heart heart diet  Nanine Means, NP 04/21/2018, 6:39 PM

## 2018-04-21 NOTE — ED Notes (Signed)
Bed: WA29 Expected date:  Expected time:  Means of arrival:  Comments: EMS-[facility] 

## 2018-04-21 NOTE — ED Provider Notes (Signed)
Emergency Department Provider Note   I have reviewed the triage vital signs and the nursing notes.   HISTORY  Chief Complaint No chief complaint on file.   HPI Clinton Mitchell is a 42 y.o. male w/ h/o CF, DM and depression here with worsening depression and suicidal ideation secondary to "a bunch of things going on in my life".  Sounds like he has broke up with a girlfriend and has been making suicidal threats as well.  Patient does not elaborate on this further states he does not want to be here anymore he feels that he is a burden on everybody else. No recent illnesses, trauma, fever, headache.  No other associated or modifying symptoms.    Past Medical History:  Diagnosis Date  . Acid reflux   . Cystic fibrosis (HCC)   . Diabetes mellitus without complication [facility])     Patient Active Problem List   Diagnosis Date Noted  . Thrush 11/07/2017  . Hyperglycemia without ketosis   . CAP (community acquired pneumonia) 11/13/2015  . Community acquired pneumonia 11/13/2015  . Cystic fibrosis (HCC) 11/13/2015  . Hyponatremia 11/13/2015  . Sinus tachycardia 11/13/2015  . Diabetes mellitus with hyperglycemia (HCC) 11/13/2015  . Diarrhea 11/13/2015  . History of herpes simplex infection 11/13/2015  . Acute respiratory failure with hypoxemia [facility])     Past Surgical History:  Procedure Laterality Date  . PANCREAS SURGERY    . WRIST SURGERY Right     Current Outpatient Rx  . Order #: [id number] Class: Historical Med  . Order #: [id number] Class: Historical Med  . Order #: [id number] Class: Historical Med  . Order #: [id number] Class: Print  . Order #: [id number] Class: Print  . Order #: [id number] Class: Historical Med  . Order #: [id number] Class: Historical Med  . Order #: [id number] Class: Historical Med  . Order #: [id number] Class: Historical Med  . Order #: [id number] Class: Print    Allergies Patient has no known allergies.  Family History  Problem Relation Age of Onset  .  Hypertension Mother     Social History Social History   Tobacco Use  . Smoking status: Never Smoker  . Smokeless tobacco: Never Used  Substance Use Topics  . Alcohol use: Yes  . Drug use: No    Review of Systems  All other systems negative except as documented in the HPI. All pertinent positives and negatives as reviewed in the HPI. ____________________________________________   PHYSICAL EXAM:  VITAL SIGNS: ED Triage Vitals  Enc Vitals Group     BP 04/21/18 1045 (!) 125/92     Pulse Rate 04/21/18 1045 (!) 102     Resp 04/21/18 1045 17     Temp 04/21/18 1045 (!) 97.4 F (36.3 C)     Temp Source 04/21/18 1045 Oral     SpO2 04/21/18 1045 98 %    Constitutional: Alert and oriented. Well appearing and in no acute distress. Eyes: Conjunctivae are normal. PERRL. EOMI. Head: Atraumatic. Nose: No congestion/rhinnorhea. Mouth/Throat: Mucous membranes are moist.  Oropharynx non-erythematous. Neck: No stridor.  No meningeal signs.   Cardiovascular: Normal rate, regular rhythm. Good peripheral circulation. Grossly normal heart sounds.   Respiratory: Normal respiratory effort.  No retractions. Lungs CTAB. Gastrointestinal: Soft and nontender. No distention.  Musculoskeletal: No lower extremity tenderness nor edema. No gross deformities of extremities. Neurologic:  Normal speech and language. No gross focal neurologic deficits are appreciated.  Skin:  Skin is warm, dry and intact. No rash noted. Psychiatric: Mood and affect are depressed. Suicidal thought  with plan to overdose. Speech and behavior are normal.  ____________________________________________   LABS (all labs ordered are listed, but only abnormal results are displayed)  Labs Reviewed  COMPREHENSIVE METABOLIC PANEL - Abnormal; Notable for the following components:      Result Value   Glucose, Bld 154 (*)    Total Protein 8.3 (*)    Total Bilirubin 1.3 (*)    All other components within normal limits    ACETAMINOPHEN LEVEL - Abnormal; Notable for the following components:   Acetaminophen (Tylenol), Serum <10 (*)    All other components within normal limits  RAPID URINE DRUG SCREEN, HOSP PERFORMED  ETHANOL  SALICYLATE LEVEL  CBC   ____________________________________________  EKG   EKG Interpretation  Date/Time:    Ventricular Rate:    PR Interval:    QRS Duration:   QT Interval:    QTC Calculation:   R Axis:     Text Interpretation:         ____________________________________________  RADIOLOGY  No results found.  ____________________________________________   PROCEDURES  Procedure(s) performed:   Procedures   ____________________________________________   INITIAL IMPRESSION / ASSESSMENT AND PLAN / ED COURSE  Anxious, suicidal. Depressed. Will medically clear and TTS for same.   Medically clear. TTS consult.  Pertinent labs & imaging results that were available during my care of the patient were reviewed by me and considered in my medical decision making (see chart for details).  ____________________________________________  FINAL CLINICAL IMPRESSION(S) / ED DIAGNOSES  Final diagnoses:  Verbalizes suicidal thoughts     MEDICATIONS GIVEN DURING THIS VISIT:  Medications - No data to display   NEW OUTPATIENT MEDICATIONS STARTED DURING THIS VISIT:  New Prescriptions   No medications on file    Note:  This note was prepared with assistance of Dragon voice recognition software. Occasional wrong-word or sound-a-like substitutions may have occurred due to the inherent limitations of voice recognition software.   Marily Memos, MD 04/21/18 [phone]

## 2018-04-21 NOTE — ED Triage Notes (Signed)
Patient just broke up with his girlfriend and he threatened to kill him self by taking a bottle of tylenol. Never actually took the pills just threatened. Patient called the SI hotline. They called [employer] and then EMS.

## 2019-02-01 IMAGING — CT CT ANGIO CHEST
2 of 6 series · 18 of 36 positions shown · IV contrast (Omni 300)
Comparison: CT chest dated November 13, 2015.

CLINICAL DATA: Cough and hemoptysis.  History of cystic fibrosis.

EXAM:
CT ANGIOGRAPHY CHEST WITH CONTRAST
TECHNIQUE: Multidetector CT imaging of the chest was performed using the
standard protocol during bolus administration of intravenous
contrast. Multiplanar CT image reconstructions and MIPs were
obtained to evaluate the vascular anatomy.
CONTRAST:  100mL A0XLTW-JP8 IOPAMIDOL (A0XLTW-JP8) INJECTION 76%

[Series 7: pe thins · axial · 0.77mm/px · z∈[+1112,+1350]mm · 17 of 268 slices shown]
[im 15/268  lung]
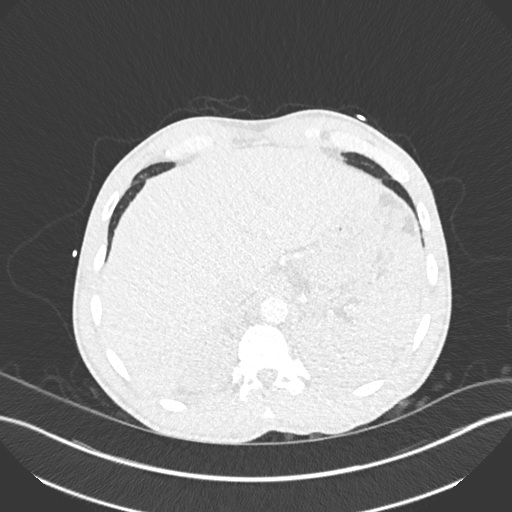
[im 30/268  mediastinal]
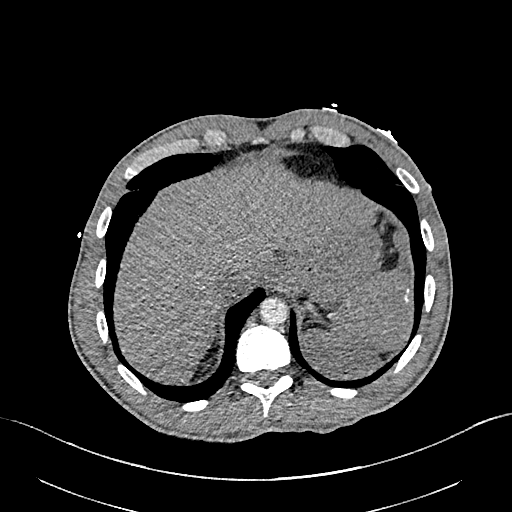
[im 45/268  lung]
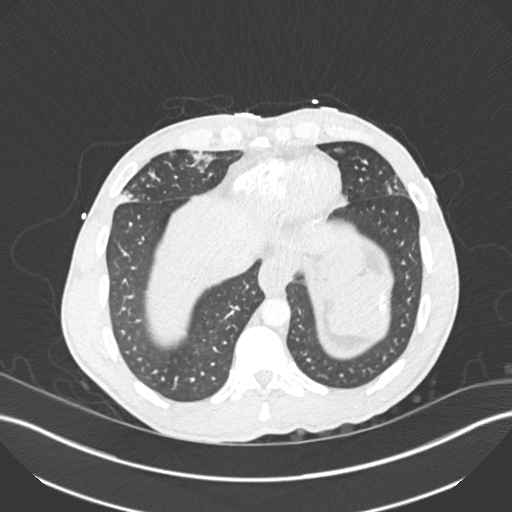
[im 60/268  mediastinal]
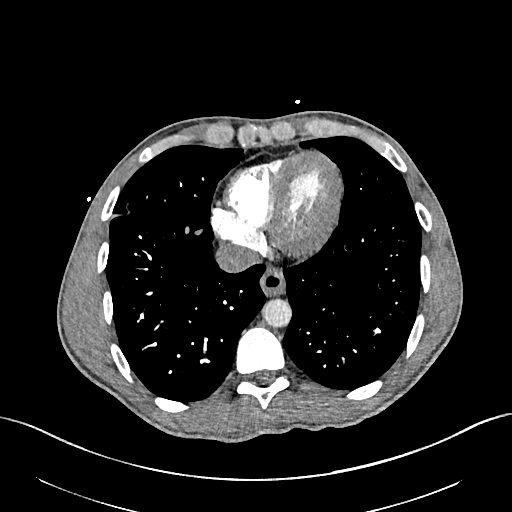
[im 75/268  lung]
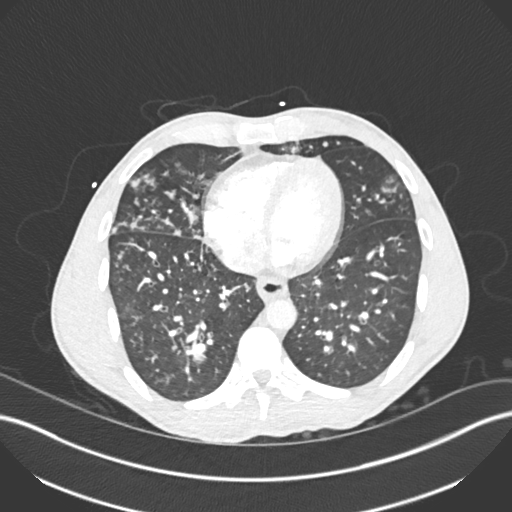
[im 90/268  mediastinal]
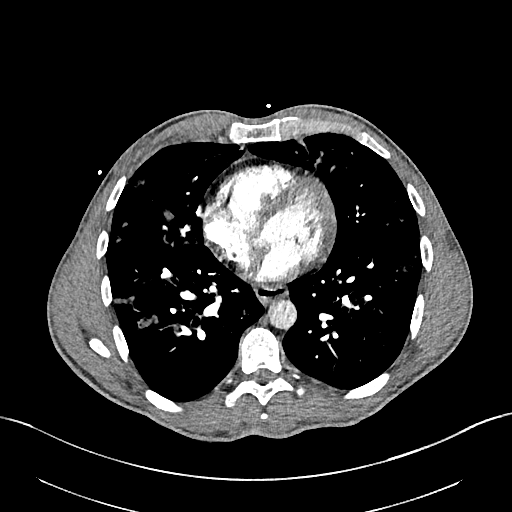
[im 104/268  lung]
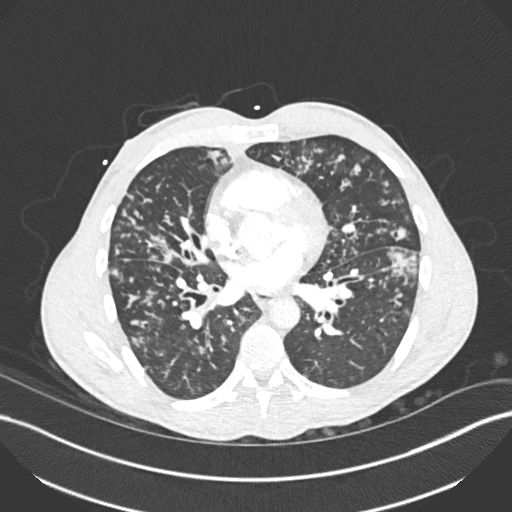
[im 119/268  mediastinal]
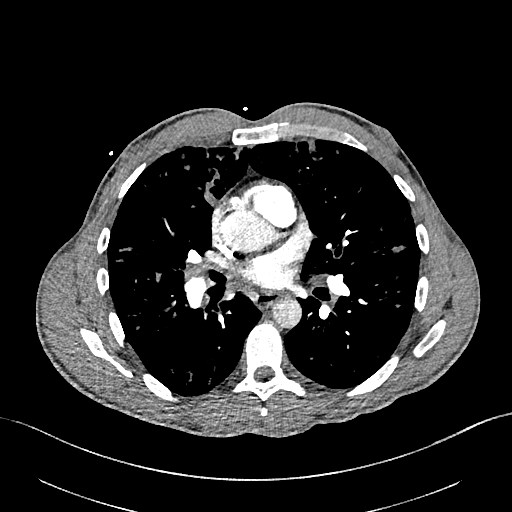
[im 134/268  lung]
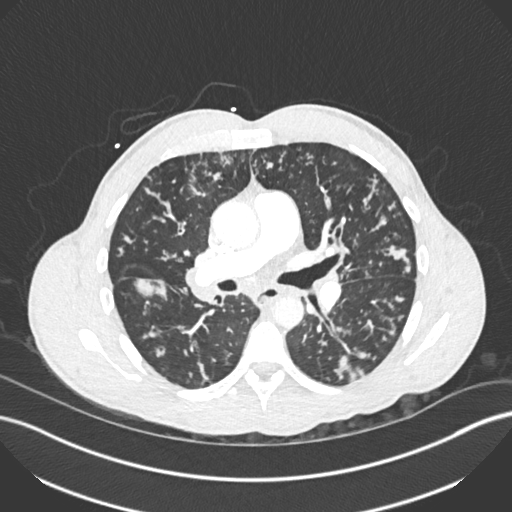
[im 149/268  mediastinal]
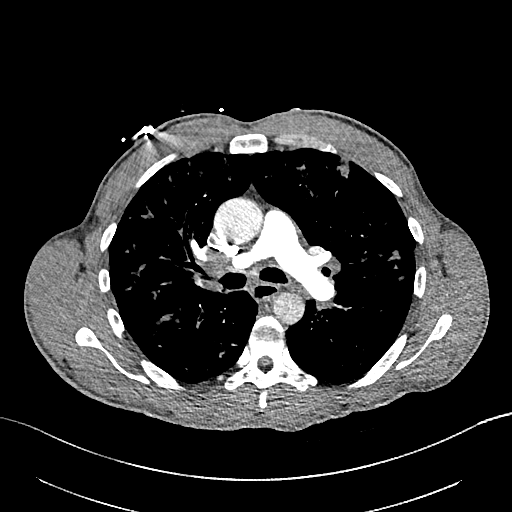
[im 164/268  lung]
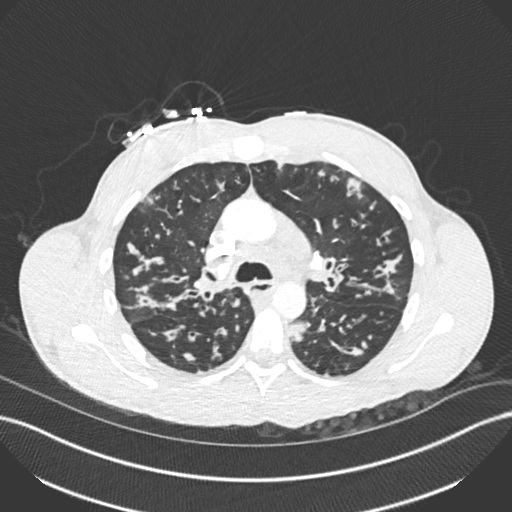
[im 179/268  mediastinal]
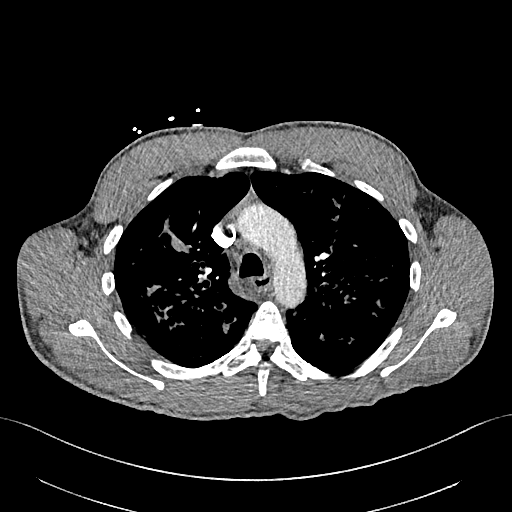
[im 193/268  lung]
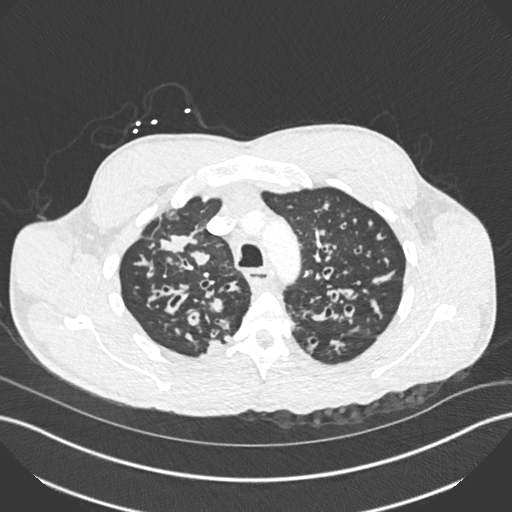
[im 208/268  mediastinal]
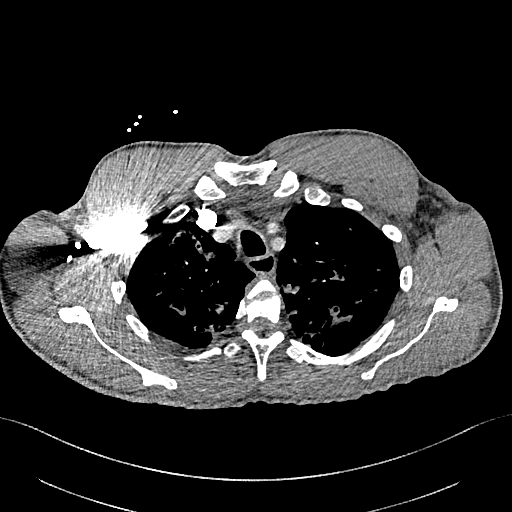
[im 223/268  lung]
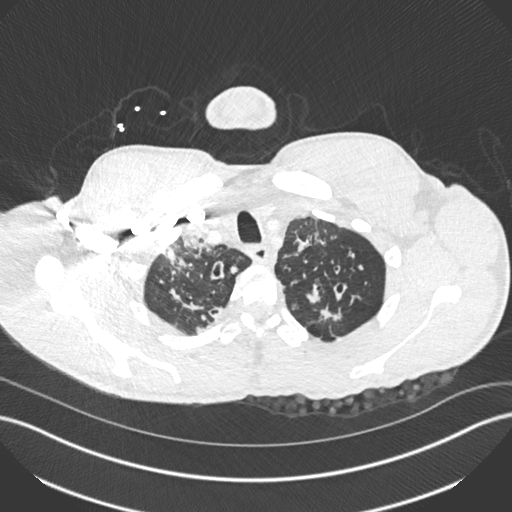
[im 238/268  mediastinal]
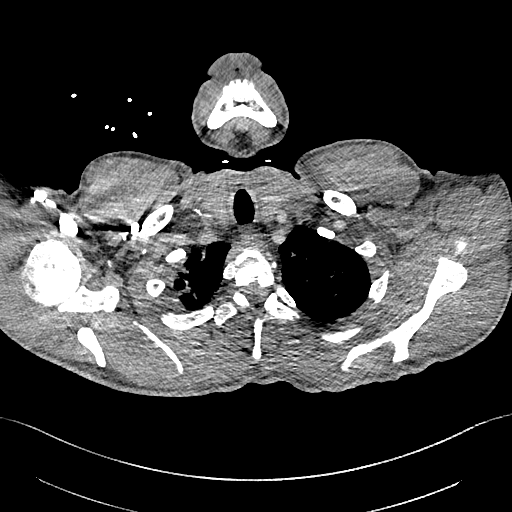
[im 253/268  lung]
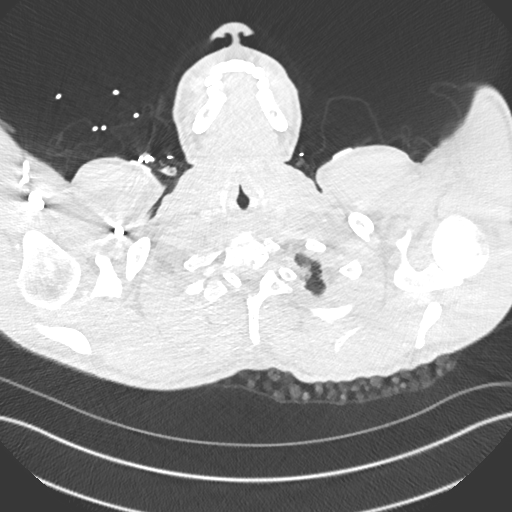

[Series 8: pe 2mm cor · coronal · 0.55mm/px · 1 of 151 slices shown]
[im 76/151  mediastinal]
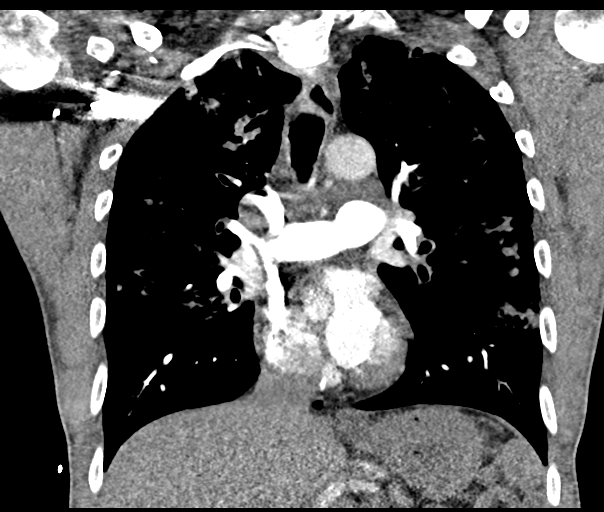

[18 of 36 positions shown; findings below may reference images not displayed]

FINDINGS: Cardiovascular: Satisfactory opacification of the pulmonary arteries
to the segmental level. No evidence of pulmonary embolism. Normal
heart size. No pericardial effusion. Normal caliber thoracic aorta.

Mediastinum/Nodes: Prominent, borderline enlarged mediastinal and
bilateral hilar lymph nodes are similar to prior study, and favored
reactive. No axillary lymphadenopathy. Thyroid gland, trachea, and
esophagus demonstrate no significant findings.

Lungs/Pleura: Extensive bilateral, upper lobe predominant
bronchiectasis and peribronchial thickening with diffuse tree-in-bud
nodularity and small areas of more peripheral, consolidative
opacities involving both lungs. Scattered areas of airways
impaction. Unchanged 2.4 cm nodular density in the right lung apex,
stable since 0222 and likely representing scar. No pleural effusion
or pneumothorax.

Upper Abdomen: No acute abnormality.

Musculoskeletal: Bilateral gynecomastia. No acute or significant
osseous findings.

Review of the MIP images confirms the above findings.
IMPRESSION: 1. No evidence of pulmonary embolism.
2. Sequela of cystic fibrosis with superimposed multifocal
bronchopneumonia involving both lungs.

## 2019-02-01 IMAGING — DX DG CHEST 2V
2 series · 2 of 2 positions shown · non-contrast
Comparison: Chest x-ray dated November 13, 2015.

CLINICAL DATA: Chest pain, cough, and hemoptysis. History of cystic
fibrosis.

EXAM:
CHEST  2 VIEW

[w chest pa]
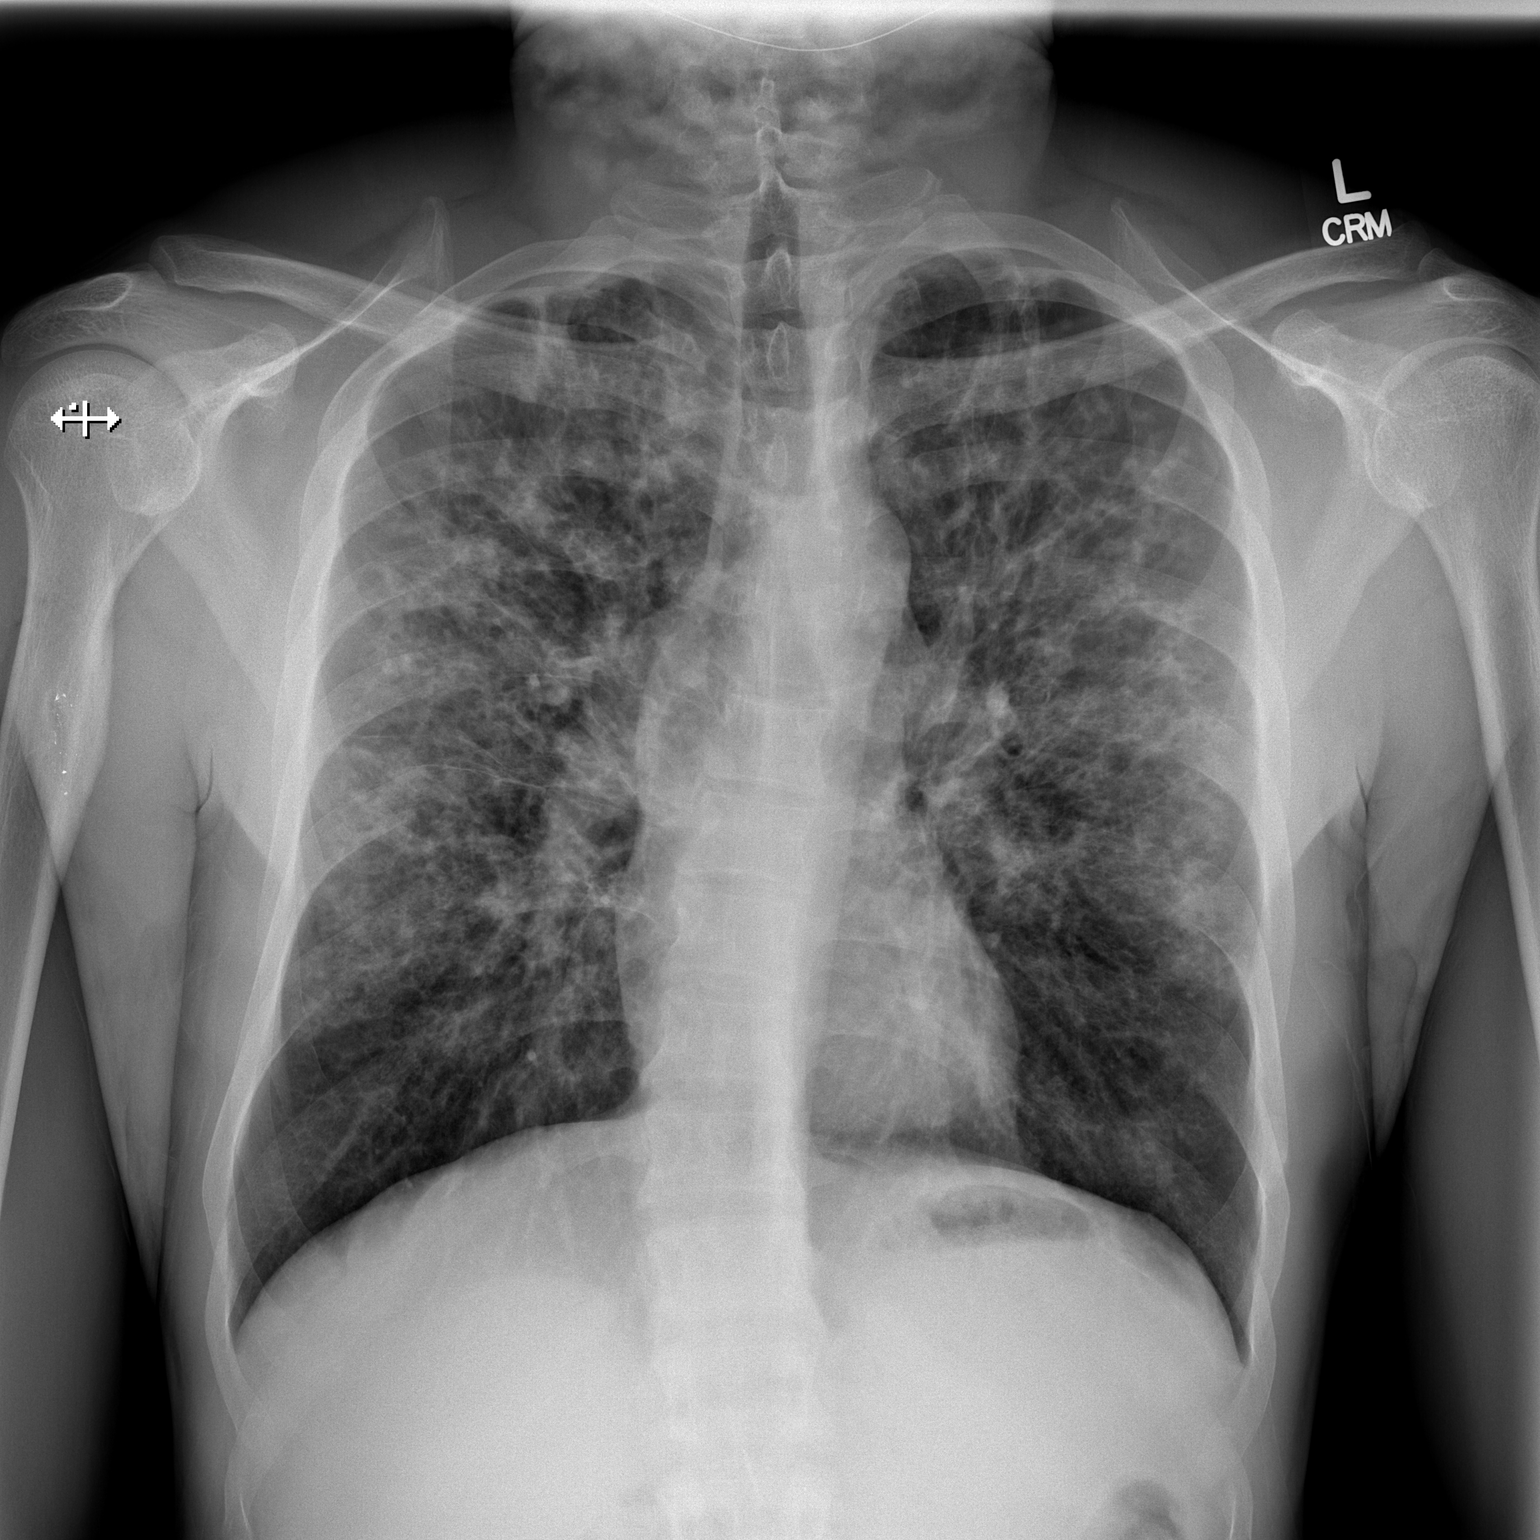

[w chest lat]
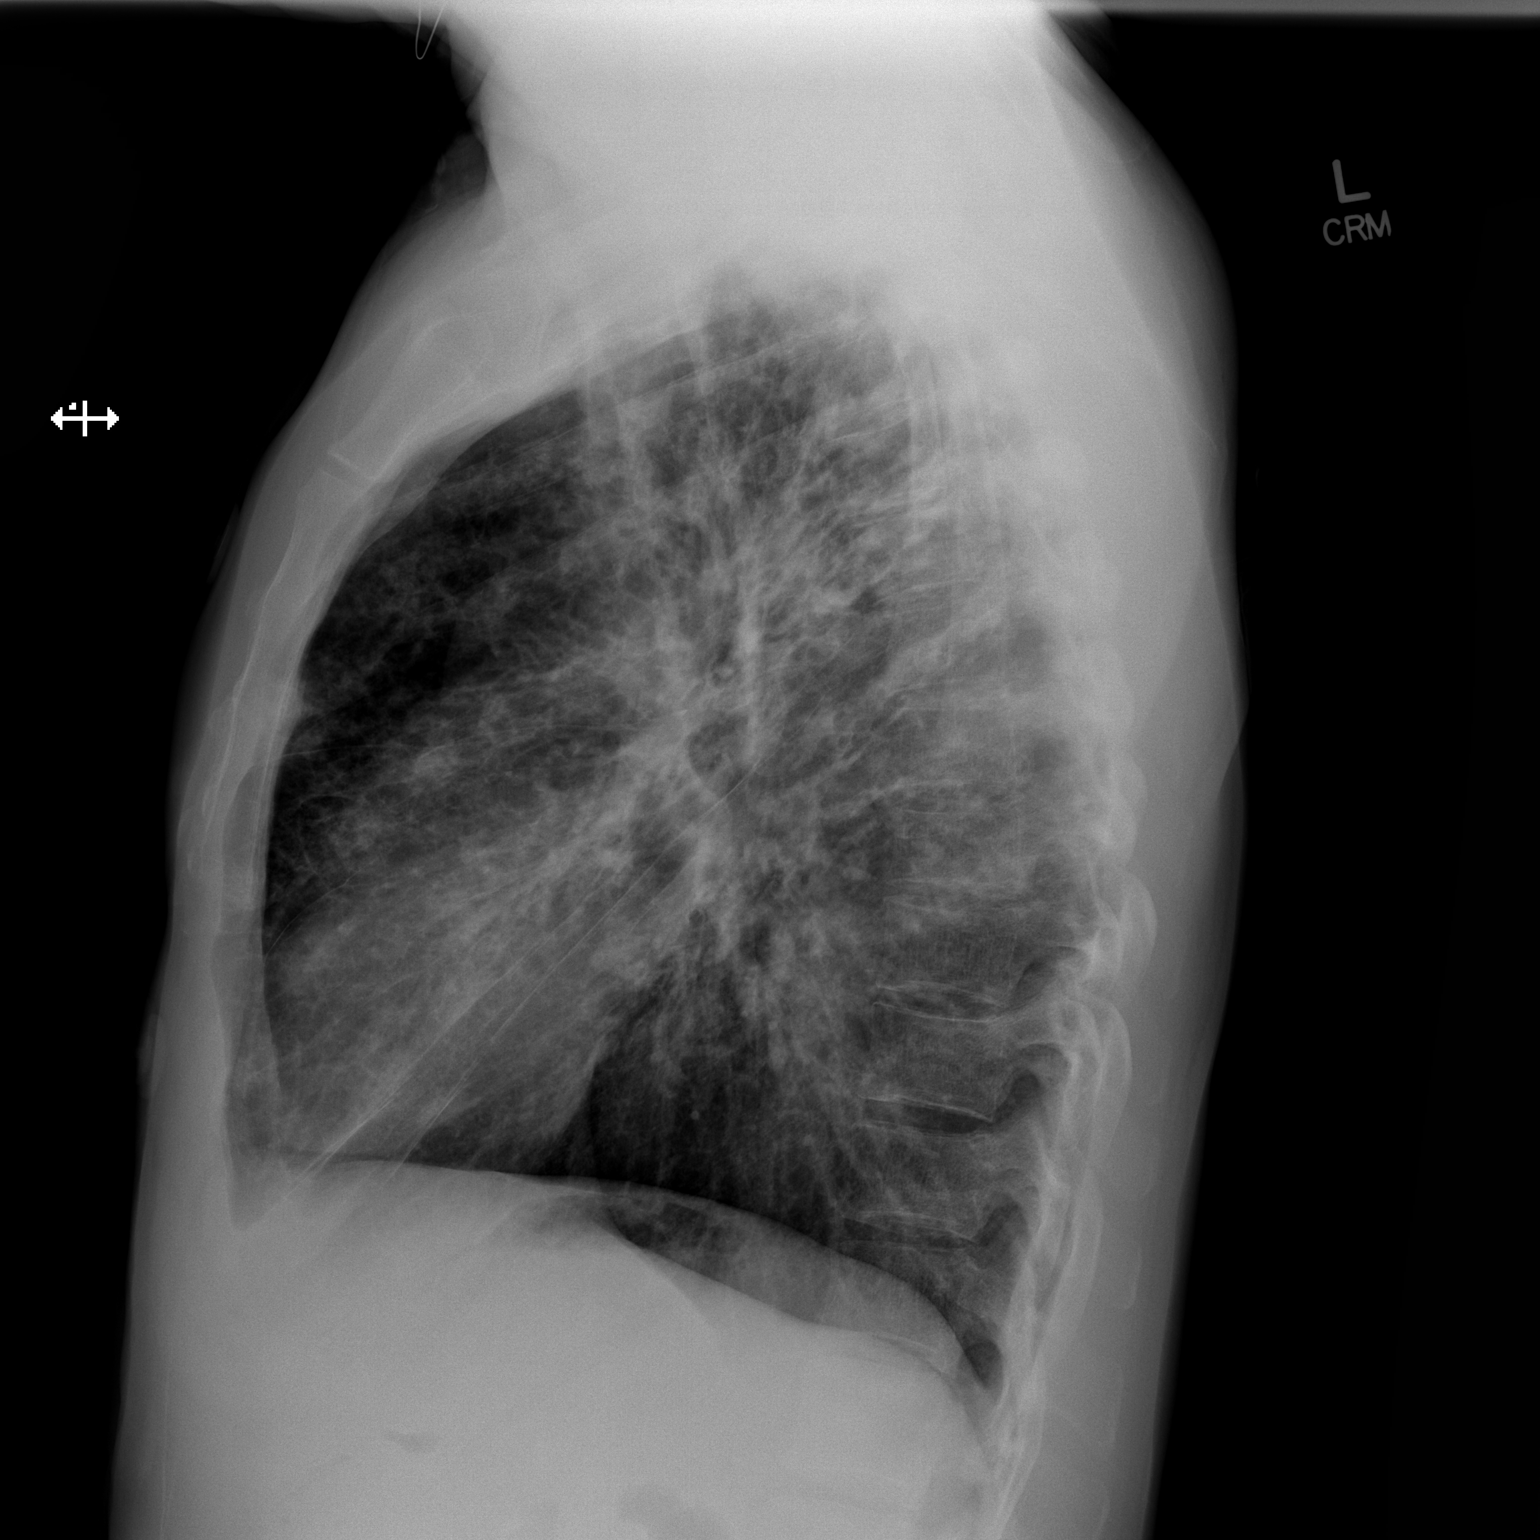

[2 of 2 positions shown; findings below may reference images not displayed]

FINDINGS: The cardiomediastinal silhouette is normal in size. Normal pulmonary
vascularity. Diffuse bilateral nodular airspace opacities and
bronchiectasis predominantly involving the bilateral upper lobes
appears slightly progressed within the anterior segments
bilaterally, as well as the right middle lobe. No pleural effusion
or pneumothorax. No acute osseous abnormality.
IMPRESSION: 1. Diffuse bilateral nodular airspace opacities and bronchiectasis
predominantly involving the bilateral upper lobes appears slightly
progressed within the anterior segments, as well as the right middle
lobe. This could reflect worsening changes related to cystic
fibrosis versus acute superimposed pneumonia.

## 2022-07-17 ENCOUNTER — Other Ambulatory Visit: Payer: Self-pay

## 2022-07-17 ENCOUNTER — Encounter (HOSPITAL_COMMUNITY): Payer: Self-pay | Admitting: *Deleted

## 2022-07-17 ENCOUNTER — Ambulatory Visit (HOSPITAL_COMMUNITY)
Admission: EM | Admit: 2022-07-17 | Discharge: 2022-07-17 | Disposition: A | Discharge status: Home / Self Care | Payer: Medicaid - Out of State | Attending: Physician Assistant | Admitting: Physician Assistant

## 2022-07-17 DIAGNOSIS — L509 Urticaria, unspecified: Secondary | ICD-10-CM

## 2022-07-17 DIAGNOSIS — G43001 Migraine without aura, not intractable, with status migrainosus: Secondary | ICD-10-CM

## 2022-07-17 MED ORDER — HYDROXYZINE HCL 10 MG PO TABS
10.0000 mg | ORAL_TABLET | Freq: Three times a day (TID) | ORAL | 0 refills | Status: DC | PRN
Start: 2022-07-17 — End: 2022-07-17

## 2022-07-17 MED ORDER — LORATADINE 10 MG PO TABS: 10.0000 mg | ORAL_TABLET | Freq: Every day | ORAL | 0 refills | Status: AC

## 2022-07-17 MED ORDER — HYDROXYZINE HCL 10 MG PO TABS
10.0000 mg | ORAL_TABLET | Freq: Three times a day (TID) | ORAL | 0 refills | Status: AC | PRN
Start: 2022-07-17 — End: ?

## 2022-07-17 MED ORDER — FAMOTIDINE 20 MG PO TABS
20.0000 mg | ORAL_TABLET | Freq: Two times a day (BID) | ORAL | 0 refills | Status: AC
Start: 2022-07-17 — End: ?

## 2022-07-17 MED ORDER — KETOROLAC TROMETHAMINE 60 MG/2ML IM SOLN
60.0000 mg | Freq: Once | INTRAMUSCULAR | Status: AC
  Administered 2022-07-17: 60 mg via INTRAMUSCULAR

## 2022-07-17 MED ORDER — KETOROLAC TROMETHAMINE 60 MG/2ML IM SOLN
INTRAMUSCULAR | Status: AC
  Filled 2022-07-17: qty 2

## 2022-07-17 NOTE — ED Triage Notes (Signed)
Pt reports he has had a fever,HA and hives for 3 days. PT reports light sensitivity with HA.

## 2022-07-17 NOTE — ED Provider Notes (Signed)
[facility]   MRN: [id number] DOB: Dec 03, 1975  Subjective:   Clinton Mitchell is a 46 y.o. male with a history of diabetes and cystic fibrosis presenting for migraine, fever, hives x3 days.  He reports that he has a history of migraines.  This migraine is consistent with his normal migraines.  Currently rates his pain 5 out of 10.  He is wearing sunglasses.  He has tried Tylenol for the pain without relief.  He states that the migraine started first and seems worse upon wakening in the mornings.  He has missed work yesterday and today.  He also reports having a temperature of up to 100 F.  He states that yesterday he also started breaking out in hives on his arms inner thighs and groin.  He denies any new medications or detergents or new foods.  No recent travel.  He has not tried any other medication to help with his symptoms.  No sick contacts.  No other symptoms to report at this time.  No current facility-administered medications for this encounter.  Current Outpatient Medications:    famotidine (PEPCID) 20 MG tablet, Take 1 tablet (20 mg total) by mouth 2 (two) times daily., Disp: 30 tablet, Rfl: 0   loratadine (CLARITIN) 10 MG tablet, Take 1 tablet (10 mg total) by mouth daily., Disp: 30 tablet, Rfl: 0   acetaminophen (TYLENOL) 325 MG tablet, Take 650 mg by mouth every 6 (six) hours as needed for mild pain., Disp: , Rfl:    albuterol (PROVENTIL HFA;VENTOLIN HFA) 108 (90 BASE) MCG/ACT inhaler, Inhale 2 puffs into the lungs every 6 (six) hours as needed for wheezing or shortness of breath., Disp: , Rfl:    citalopram (CELEXA) 10 MG tablet, Take 1 tablet (10 mg total) by mouth daily. Take one tablet for five days then increase to two tablets daily, Disp: 60 tablet, Rfl: 8   hydrOXYzine (ATARAX) 10 MG tablet, Take 1 tablet (10 mg total) by mouth 3 (three) times daily as needed for itching., Disp: 30 tablet, Rfl: 0   insulin detemir (LEVEMIR) 100 UNIT/ML injection, Inject 0.25  mLs (25 Units total) into the skin daily., Disp: 10 mL, Rfl: 0   Multiple Vitamin (MULTIVITAMIN WITH MINERALS) TABS tablet, Take 1 tablet by mouth daily., Disp: , Rfl:    omeprazole (PRILOSEC) 20 MG capsule, Take 20 mg by mouth daily., Disp: , Rfl:    Pancrelipase, Lip-Prot-Amyl, (CREON) 24000 UNITS CPEP, Take 2-3 capsules by mouth See admin instructions. 3 capsules with meals and two capsules with snacks, Disp: , Rfl:    No Known Allergies  Past Medical History:  Diagnosis Date   Acid reflux    Cystic fibrosis (HCC)    Diabetes mellitus without complication (HCC)      Past Surgical History:  Procedure Laterality Date   PANCREAS SURGERY     WRIST SURGERY Right     Family History  Problem Relation Age of Onset   Hypertension Mother     Social History   Tobacco Use   Smoking status: Never   Smokeless tobacco: Never  Vaping Use   Vaping Use: Never used  Substance Use Topics   Alcohol use: Yes   Drug use: No    ROS REFER TO HPI FOR PERTINENT POSITIVES AND NEGATIVES   Objective:   Vitals: BP 133/82   Pulse 99   Temp 98.7 F (37.1 C)   Resp 18   SpO2 98%   Physical Exam Vitals and  nursing note reviewed.  Constitutional:      General: He is not in acute distress.    Appearance: Normal appearance. He is not ill-appearing.  HENT:     Head: Normocephalic.     Right Ear: Tympanic membrane, ear canal and external ear normal.     Left Ear: Tympanic membrane, ear canal and external ear normal.     Nose: No congestion.     Mouth/Throat:     Mouth: Mucous membranes are moist.     Pharynx: No oropharyngeal exudate or posterior oropharyngeal erythema.  Eyes:     Extraocular Movements: Extraocular movements intact.     Conjunctiva/sclera: Conjunctivae normal.     Pupils: Pupils are equal, round, and reactive to light.  Cardiovascular:     Rate and Rhythm: Normal rate and regular rhythm.     Pulses: Normal pulses.     Heart sounds: Normal heart sounds. No murmur  heard. Pulmonary:     Effort: Pulmonary effort is normal. No respiratory distress.     Breath sounds: Normal breath sounds. No wheezing.  Musculoskeletal:     Cervical back: Normal range of motion and neck supple.  Skin:    General: Skin is warm.     Findings: Rash present. Rash is urticarial (Few hives noted on bilateral upper arms, inner thighs.).  Neurological:     Mental Status: He is alert and oriented to person, place, and time.     Cranial Nerves: No cranial nerve deficit or facial asymmetry.     Sensory: No sensory deficit.     Motor: No weakness.     Coordination: Romberg sign negative. Coordination normal.     Gait: Gait normal.  Psychiatric:        Mood and Affect: Mood normal. Mood is not anxious or depressed.        Behavior: Behavior normal. Behavior is not agitated.     No results found for this or any previous visit (from the past 24 hour(s)).  Assessment and Plan :   PDMP not reviewed this encounter.  1. Migraine without aura and with status migrainosus, not intractable   2. Hives     Overall well-appearing 46 year old male with history cystic fibrosis and diabetes. No increased cough or SOB. No issues with sugars at this time.  He has a history of migraines.  Migraine is consistent with his usual history.  No focal neurologic deficits.  No red flags on exam.  Gave Toradol 60 mg IM in urgent care today.  Unsure what is causing his hives at this time.  Plan to treat with Pepcid, hydroxyzine, loratadine.  Patient reported subjective temperature at home, no fever noted in urgent care today.  Work note provided for the last 2 days.  Red flags discussed.  He will recheck as needed.   AllwardtCrist Infante, PA-C 07/17/22 1115

## 2022-07-17 NOTE — Discharge Instructions (Signed)
Good to meet you today.  Toradol 60 mg given in the urgent care today to help with your migraine pain. Please go home and rest.  For the hives, please take Pepcid, loratadine, hydroxyzine as directed.  Keep cool and hydrated.  For any acute worsening symptoms, please present immediately to the emergency department.

## 2022-08-01 ENCOUNTER — Ambulatory Visit (HOSPITAL_COMMUNITY)
Admission: EM | Admit: 2022-08-01 | Discharge: 2022-08-01 | Disposition: A | Discharge status: Home / Self Care | Payer: Medicaid - Out of State

## 2022-08-01 ENCOUNTER — Encounter (HOSPITAL_COMMUNITY): Payer: Self-pay | Admitting: *Deleted

## 2022-08-01 DIAGNOSIS — G43101 Migraine with aura, not intractable, with status migrainosus: Secondary | ICD-10-CM | POA: Diagnosis not present

## 2022-08-01 HISTORY — DX: Migraine, unspecified, not intractable, without status migrainosus: G43.909

## 2022-08-01 MED ORDER — ONDANSETRON HCL 4 MG PO TABS: 4.0000 mg | ORAL_TABLET | Freq: Three times a day (TID) | ORAL | 0 refills | Status: AC | PRN

## 2022-08-01 MED ORDER — SUMATRIPTAN SUCCINATE 50 MG PO TABS: 50.0000 mg | ORAL_TABLET | ORAL | 0 refills | Status: AC | PRN

## 2022-08-01 MED ORDER — KETOROLAC TROMETHAMINE 30 MG/ML IJ SOLN: 30.0000 mg | Freq: Once | INTRAMUSCULAR | Status: DC

## 2022-08-01 NOTE — Discharge Instructions (Addendum)
Can take Imitrex for migraine, if no response can repeat once within 1-2 hours.  Do not take more than 4 tablets in 24 hours.  Can take Zofran as needed for nausea.

## 2022-08-01 NOTE — ED Triage Notes (Signed)
Pt was seen 07/17/22 for same; reports a long hx migraines. States he got relief from shot he received, but the Rx meds he received have not been working. States this migraine started 2 days ago, and feels like his typical migraine. Denies any new sxs.

## 2022-08-01 NOTE — ED Notes (Signed)
Pt did not received his Toradol injection. Attempted to call patient

## 2022-08-01 NOTE — ED Provider Notes (Signed)
[facility]    CSN: [id number] Arrival date & time: 08/01/22  1451      History   Chief Complaint Chief Complaint  Patient presents with   Migraine    HPI Clinton Mitchell is a 46 y.o. male.   Pt presents with typical migraine sx.  He was seen 07/17/2022.  Reports Toradol provided good relief of migraine at that time for several days.  He reports headache became worse two days ago.  He reports light sensitivity.  He reports missing work yesterday and today due to headache. OTC medications are not helpful.  Reports some intermittent nausea, no vomiting.     Past Medical History:  Diagnosis Date   Acid reflux    Cystic fibrosis (HCC)    Diabetes mellitus without complication (HCC)    Migraines     Patient Active Problem List   Diagnosis Date Noted   Thrush 11/07/2017   Hyperglycemia without ketosis    CAP (community acquired pneumonia) 11/13/2015   Community acquired pneumonia 11/13/2015   Cystic fibrosis (HCC) 11/13/2015   Hyponatremia 11/13/2015   Sinus tachycardia 11/13/2015   Diabetes mellitus with hyperglycemia (HCC) 11/13/2015   Diarrhea 11/13/2015   History of herpes simplex infection 11/13/2015   Acute respiratory failure with hypoxemia [facility])     Past Surgical History:  Procedure Laterality Date   PANCREAS SURGERY     WRIST SURGERY Right        Home Medications    Prior to Admission medications   Medication Sig Start Date End Date Taking? Authorizing Provider  Elexacaftor-Tezacaftor-Ivacaft (TRIKAFTA PO) Take by mouth.   Yes [provider]  famotidine (PEPCID) 20 MG tablet Take 1 tablet (20 mg total) by mouth 2 (two) times daily. 07/17/22  Yes Allwardt, Alyssa M, PA-C  hydrOXYzine (ATARAX) 10 MG tablet Take 1 tablet (10 mg total) by mouth 3 (three) times daily as needed for itching. 07/17/22  Yes Allwardt, Alyssa M, PA-C  insulin detemir (LEVEMIR) 100 UNIT/ML injection Inject 0.25 mLs (25 Units total) into the skin daily. 11/08/17   Yes Noralee Stain, DO  loratadine (CLARITIN) 10 MG tablet Take 1 tablet (10 mg total) by mouth daily. 07/17/22  Yes Allwardt, Alyssa M, PA-C  Multiple Vitamin (MULTIVITAMIN WITH MINERALS) TABS tablet Take 1 tablet by mouth daily.   Yes [provider]  omeprazole (PRILOSEC) 20 MG capsule Take 20 mg by mouth daily.   Yes [provider]  ondansetron (ZOFRAN) 4 MG tablet Take 1 tablet (4 mg total) by mouth every 8 (eight) hours as needed for nausea or vomiting. 08/01/22  Yes Ward, Tylene Fantasia, PA-C  Pancrelipase, Lip-Prot-Amyl, (CREON) 24000 UNITS CPEP Take 2-3 capsules by mouth See admin instructions. 3 capsules with meals and two capsules with snacks   Yes [provider]  SUMAtriptan (IMITREX) 50 MG tablet Take 1 tablet (50 mg total) by mouth every 2 (two) hours as needed for migraine. May repeat in 2 hours if headache persists or recurs. 08/01/22  Yes Ward, Tylene Fantasia, PA-C  acetaminophen (TYLENOL) 325 MG tablet Take 650 mg by mouth every 6 (six) hours as needed for mild pain.    [provider]  albuterol (PROVENTIL HFA;VENTOLIN HFA) 108 (90 BASE) MCG/ACT inhaler Inhale 2 puffs into the lungs every 6 (six) hours as needed for wheezing or shortness of breath.    [provider]  citalopram (CELEXA) 10 MG tablet Take 1 tablet (10 mg total) by mouth daily. Take one tablet for five  days then increase to two tablets daily 04/21/18   Charm Rings, NP    Family History Family History  Problem Relation Age of Onset   Hypertension Mother     Social History Social History   Tobacco Use   Smoking status: Never   Smokeless tobacco: Never  Vaping Use   Vaping Use: Never used  Substance Use Topics   Alcohol use: Yes    Comment: occasionally   Drug use: Never     Allergies   Patient has no known allergies.   Review of Systems Review of Systems  Constitutional:  Negative for chills and fever.  HENT:  Negative for ear pain and sore throat.   Eyes:   Negative for pain and visual disturbance.  Respiratory:  Negative for cough and shortness of breath.   Cardiovascular:  Negative for chest pain and palpitations.  Gastrointestinal:  Positive for vomiting. Negative for abdominal pain.  Genitourinary:  Negative for dysuria and hematuria.  Musculoskeletal:  Negative for arthralgias and back pain.  Skin:  Negative for color change and rash.  Neurological:  Positive for headaches. Negative for seizures and syncope.  All other systems reviewed and are negative.    Physical Exam Triage Vital Signs ED Triage Vitals  Enc Vitals Group     BP 08/01/22 1519 133/79     Pulse Rate 08/01/22 1519 (!) 105     Resp 08/01/22 1519 18     Temp 08/01/22 1519 98.2 F (36.8 C)     Temp Source 08/01/22 1519 Oral     SpO2 08/01/22 1519 98 %     Weight --      Height --      Head Circumference --      Peak Flow --      Pain Score 08/01/22 1520 7     Pain Loc --      Pain Edu? --      Excl. in GC? --    No data found.  Updated Vital Signs BP 133/79   Pulse (!) 105   Temp 98.2 F (36.8 C) (Oral)   Resp 18   SpO2 98%   Visual Acuity Right Eye Distance:   Left Eye Distance:   Bilateral Distance:    Right Eye Near:   Left Eye Near:    Bilateral Near:     Physical Exam Vitals and nursing note reviewed.  Constitutional:      General: He is not in acute distress.    Appearance: He is well-developed.  HENT:     Head: Normocephalic and atraumatic.  Eyes:     Conjunctiva/sclera: Conjunctivae normal.  Cardiovascular:     Rate and Rhythm: Normal rate and regular rhythm.     Heart sounds: No murmur heard. Pulmonary:     Effort: Pulmonary effort is normal. No respiratory distress.     Breath sounds: Normal breath sounds.  Abdominal:     Palpations: Abdomen is soft.     Tenderness: There is no abdominal tenderness.  Musculoskeletal:        General: No swelling.     Cervical back: Neck supple.  Skin:    General: Skin is warm and dry.      Capillary Refill: Capillary refill takes less than 2 seconds.  Neurological:     Mental Status: He is alert.  Psychiatric:        Mood and Affect: Mood normal.      UC Treatments / Results  Labs (  all labs ordered are listed, but only abnormal results are displayed) Labs Reviewed - No data to display  EKG   Radiology No results found.  Procedures Procedures (including critical care time)  Medications Ordered in UC Medications  ketorolac (TORADOL) 30 MG/ML injection 30 mg (has no administration in time range)    Initial Impression / Assessment and Plan / UC Course  I have reviewed the triage vital signs and the nursing notes.  Pertinent labs & imaging results that were available during my care of the patient were reviewed by me and considered in my medical decision making (see chart for details).     Migraine, pt reports today's sx feel like his typical migraine.  He reports good relief with toradol at last visit.  Toradol given today.  Will send in prescription for zofran and Imitrex. PCP follow up recommended.  Final Clinical Impressions(s) / UC Diagnoses   Final diagnoses:  Migraine with aura and with status migrainosus, not intractable     Discharge Instructions      Can take Imitrex for migraine, if no response can repeat once within 1-2 hours.  Do not take more than 4 tablets in 24 hours.  Can take Zofran as needed for nausea.     ED Prescriptions     Medication Sig Dispense Auth. Provider   SUMAtriptan (IMITREX) 50 MG tablet Take 1 tablet (50 mg total) by mouth every 2 (two) hours as needed for migraine. May repeat in 2 hours if headache persists or recurs. 10 tablet Ward, Shanda Bumps Z, PA-C   ondansetron (ZOFRAN) 4 MG tablet Take 1 tablet (4 mg total) by mouth every 8 (eight) hours as needed for nausea or vomiting. 20 tablet Ward, Tylene Fantasia, PA-C      PDMP not reviewed this encounter.   Ward, Tylene Fantasia, PA-C 08/01/22 1556

## 2022-10-05 ENCOUNTER — Emergency Department (HOSPITAL_COMMUNITY): Payer: Self-pay

## 2022-10-05 ENCOUNTER — Other Ambulatory Visit: Payer: Self-pay

## 2022-10-05 ENCOUNTER — Emergency Department (HOSPITAL_COMMUNITY)
Admission: EM | Admit: 2022-10-05 | Discharge: 2022-10-05 | Discharge status: Against Medical Advice | Payer: Self-pay | Attending: Emergency Medicine | Admitting: Emergency Medicine

## 2022-10-05 DIAGNOSIS — R0789 Other chest pain: Secondary | ICD-10-CM | POA: Insufficient documentation

## 2022-10-05 DIAGNOSIS — Z5321 Procedure and treatment not carried out due to patient leaving prior to being seen by health care provider: Secondary | ICD-10-CM | POA: Insufficient documentation

## 2022-10-05 LAB — CBC
HCT: 37.2 % — ABNORMAL LOW (ref 39.0–52.0)
Hemoglobin: 13 g/dL (ref 13.0–17.0)
MCH: 32.5 pg (ref 26.0–34.0)
MCHC: 34.9 g/dL (ref 30.0–36.0)
MCV: 93 fL (ref 80.0–100.0)
Platelets: 162 10*3/uL (ref 150–400)
RBC: 4 MIL/uL — ABNORMAL LOW (ref 4.22–5.81)
RDW: 11.9 % (ref 11.5–15.5)
WBC: 4.8 10*3/uL (ref 4.0–10.5)
nRBC: 0 % (ref 0.0–0.2)

## 2022-10-05 LAB — BASIC METABOLIC PANEL
Anion gap: 14 (ref 5–15)
BUN: 14 mg/dL (ref 6–20)
CO2: 21 mmol/L — ABNORMAL LOW (ref 22–32)
Calcium: 9 mg/dL (ref 8.9–10.3)
Chloride: 103 mmol/L (ref 98–111)
Creatinine, Ser: 0.95 mg/dL (ref 0.61–1.24)
GFR, Estimated: >60 mL/min (ref >60)
Glucose, Bld: 193 mg/dL — ABNORMAL HIGH (ref 70–99)
Potassium: 3.5 mmol/L (ref 3.5–5.1)
Sodium: 138 mmol/L (ref 135–145)

## 2022-10-05 LAB — TROPONIN I (HIGH SENSITIVITY): Troponin I (High Sensitivity): 5 ng/L (ref <18)

## 2022-10-05 NOTE — ED Notes (Signed)
Pt taken to Xray before triage

## 2022-10-05 NOTE — ED Provider Triage Note (Signed)
Emergency Medicine Provider Triage Evaluation Note  Clinton Mitchell , a 46 y.o. male  was evaluated in triage.  Pt complains of 30 minutes of left sided chest pressure that started suddenly upon waking up from sleep. He says it radiates across his chest. It is worse with taking a deep breath. Denies shortness of breath, syncope, dizziness, abdominal pain, nausea, vomiting. No hx of blood clots, recent travel, or surgeries  Review of Systems  Positive:  Negative:   Physical Exam  There were no vitals taken for this visit. Gen:   Awake, no distress   Resp:  Normal effort  MSK:   Moves extremities without difficulty  Other:    Medical Decision Making  Medically screening exam initiated at 8:23 AM.  Appropriate orders placed.  Clinton Mitchell was informed that the remainder of the evaluation will be completed by another provider, this initial triage assessment does not replace that evaluation, and the importance of remaining in the ED until their evaluation is complete.     Clinton Mitchell, [place] 10/05/22 [phone]

## 2022-10-05 NOTE — ED Triage Notes (Signed)
Pt. Stated, Im having chest pain and can't breath. I have cystic fivbrosis

## 2022-10-05 NOTE — ED Notes (Signed)
Pt called multiple times for recheck vitals, no answer 

## 2022-10-05 NOTE — ED Notes (Signed)
Pt did not answer for vital update.
